# Patient Record
Sex: Male | Born: 1935 | Race: White | Hispanic: Yes
Health system: Midwestern US, Academic
[De-identification: ages and names within clinical notes are randomized; demographics above are authoritative.]

---

## 2016-12-25 ENCOUNTER — Encounter: Admit: 2016-12-25 | Discharge: 2016-12-25 | Payer: MEDICARE

## 2016-12-25 DIAGNOSIS — M25562 Pain in left knee: Principal | ICD-10-CM

## 2016-12-31 ENCOUNTER — Ambulatory Visit: Admit: 2016-12-31 | Discharge: 2016-12-31 | Payer: Commercial Managed Care - HMO

## 2016-12-31 ENCOUNTER — Encounter: Admit: 2016-12-31 | Discharge: 2016-12-31 | Payer: MEDICARE

## 2016-12-31 ENCOUNTER — Ambulatory Visit: Admit: 2016-12-31 | Discharge: 2016-12-31 | Payer: MEDICARE

## 2016-12-31 DIAGNOSIS — M25562 Pain in left knee: ICD-10-CM

## 2016-12-31 DIAGNOSIS — N400 Enlarged prostate without lower urinary tract symptoms: Principal | ICD-10-CM

## 2016-12-31 DIAGNOSIS — I8393 Asymptomatic varicose veins of bilateral lower extremities: ICD-10-CM

## 2016-12-31 DIAGNOSIS — M1712 Unilateral primary osteoarthritis, left knee: Principal | ICD-10-CM

## 2016-12-31 MED ORDER — DEXAMETHASONE SODIUM PHOSPHATE 10 MG/ML IJ SOLN
10 mg | Freq: Once | 0 refills | Status: CP | PRN
Start: 2016-12-31 — End: ?
  Administered 2016-12-31: 22:00:00 10 mg via INTRA_ARTICULAR

## 2016-12-31 MED ORDER — METHYLPREDNISOLONE ACETATE 80 MG/ML IJ SUSP
80 mg | Freq: Once | INTRA_ARTICULAR | 0 refills | Status: CP | PRN
Start: 2016-12-31 — End: ?
  Administered 2016-12-31: 22:00:00 80 mg via INTRA_ARTICULAR

## 2016-12-31 MED ORDER — ROPIVACAINE (PF) 5 MG/ML (0.5 %) IJ SOLN
5 mL | Freq: Once | INTRAMUSCULAR | 0 refills | Status: CP | PRN
Start: 2016-12-31 — End: ?
  Administered 2016-12-31: 22:00:00 5 mL via INTRAMUSCULAR

## 2017-08-14 ENCOUNTER — Ambulatory Visit: Admit: 2017-08-14 | Discharge: 2017-08-14 | Payer: Commercial Managed Care - HMO

## 2017-08-14 ENCOUNTER — Encounter: Admit: 2017-08-14 | Discharge: 2017-08-14 | Payer: MEDICARE

## 2017-08-14 DIAGNOSIS — L97509 Non-pressure chronic ulcer of other part of unspecified foot with unspecified severity: Principal | ICD-10-CM

## 2017-08-14 DIAGNOSIS — L304 Erythema intertrigo: ICD-10-CM

## 2017-08-14 DIAGNOSIS — N400 Enlarged prostate without lower urinary tract symptoms: Principal | ICD-10-CM

## 2017-08-14 DIAGNOSIS — I8393 Asymptomatic varicose veins of bilateral lower extremities: ICD-10-CM

## 2017-08-14 MED ORDER — NYSTATIN 100,000 UNIT/GRAM TP POWD
Freq: Once | TOPICAL | 0 refills | Status: AC
Start: 2017-08-14 — End: ?

## 2017-08-28 ENCOUNTER — Ambulatory Visit: Admit: 2017-08-28 | Discharge: 2017-08-28 | Payer: Commercial Managed Care - HMO

## 2017-08-28 ENCOUNTER — Encounter: Admit: 2017-08-28 | Discharge: 2017-08-28 | Payer: MEDICARE

## 2017-08-28 DIAGNOSIS — L304 Erythema intertrigo: Secondary | ICD-10-CM

## 2017-08-28 DIAGNOSIS — L97509 Non-pressure chronic ulcer of other part of unspecified foot with unspecified severity: Principal | ICD-10-CM

## 2017-08-28 DIAGNOSIS — N400 Enlarged prostate without lower urinary tract symptoms: Principal | ICD-10-CM

## 2017-08-28 DIAGNOSIS — I8393 Asymptomatic varicose veins of bilateral lower extremities: ICD-10-CM

## 2017-09-16 ENCOUNTER — Encounter: Admit: 2017-09-16 | Discharge: 2017-09-16 | Payer: MEDICARE

## 2017-09-16 ENCOUNTER — Ambulatory Visit: Admit: 2017-09-16 | Discharge: 2017-09-16 | Payer: Commercial Managed Care - HMO

## 2017-09-16 DIAGNOSIS — I8393 Asymptomatic varicose veins of bilateral lower extremities: ICD-10-CM

## 2017-09-16 DIAGNOSIS — N400 Enlarged prostate without lower urinary tract symptoms: Principal | ICD-10-CM

## 2017-09-16 DIAGNOSIS — L304 Erythema intertrigo: Principal | ICD-10-CM

## 2017-10-02 ENCOUNTER — Ambulatory Visit: Admit: 2017-10-02 | Discharge: 2017-10-03 | Payer: Commercial Managed Care - HMO

## 2017-10-02 ENCOUNTER — Encounter: Admit: 2017-10-02 | Discharge: 2017-10-02 | Payer: MEDICARE

## 2017-10-02 DIAGNOSIS — I8393 Asymptomatic varicose veins of bilateral lower extremities: ICD-10-CM

## 2017-10-02 DIAGNOSIS — N400 Enlarged prostate without lower urinary tract symptoms: Principal | ICD-10-CM

## 2017-10-02 MED ORDER — NYSTATIN 100,000 UNIT/GRAM TP POWD
Freq: Once | TOPICAL | 0 refills | Status: CP
Start: 2017-10-02 — End: ?
  Administered 2017-10-02: 17:00:00 via TOPICAL

## 2017-10-03 DIAGNOSIS — L304 Erythema intertrigo: Principal | ICD-10-CM

## 2018-04-13 ENCOUNTER — Encounter: Admit: 2018-04-13 | Discharge: 2018-04-13 | Payer: MEDICARE

## 2018-04-13 ENCOUNTER — Encounter: Admit: 2018-04-13 | Discharge: 2018-04-14 | Payer: MEDICARE

## 2018-04-14 ENCOUNTER — Encounter: Admit: 2018-04-14 | Discharge: 2018-04-14 | Payer: MEDICARE

## 2018-04-14 ENCOUNTER — Inpatient Hospital Stay: Admit: 2018-04-14 | Discharge: 2018-04-14 | Payer: MEDICARE

## 2018-04-14 ENCOUNTER — Observation Stay: Admit: 2018-04-14 | Discharge: 2018-04-17 | Payer: Commercial Managed Care - HMO

## 2018-04-14 DIAGNOSIS — N400 Enlarged prostate without lower urinary tract symptoms: Principal | ICD-10-CM

## 2018-04-14 DIAGNOSIS — I8393 Asymptomatic varicose veins of bilateral lower extremities: ICD-10-CM

## 2018-04-14 LAB — BASIC METABOLIC PANEL
Lab: 1 mg/dL (ref 0.4–1.24)
Lab: 106 MMOL/L (ref 98–110)
Lab: 134 mg/dL — ABNORMAL HIGH (ref 70–100)
Lab: 138 MMOL/L — ABNORMAL LOW (ref 137–147)
Lab: 23 MMOL/L (ref 21–30)
Lab: 28 mg/dL — ABNORMAL HIGH (ref 7–25)
Lab: 5.1 MMOL/L (ref 3.5–5.1)
Lab: 8.4 mg/dL — ABNORMAL LOW (ref 8.5–10.6)
Lab: 9 % (ref 3–12)
Lab: >60 mL/min (ref >60)
Lab: >60 mL/min (ref >60)

## 2018-04-14 LAB — PTT (APTT): Lab: 28 s — ABNORMAL LOW (ref 24.0–36.5)

## 2018-04-14 LAB — PROTIME INR (PT): Lab: 1 M/UL — ABNORMAL LOW (ref 0.8–1.2)

## 2018-04-14 LAB — CBC: Lab: 11 10*3/uL — ABNORMAL HIGH (ref 4.5–11.0)

## 2018-04-14 LAB — CREATINE KINASE-CPK: Lab: 121 U/L — ABNORMAL HIGH (ref 35–232)

## 2018-04-14 LAB — POC GLUCOSE: Lab: 121 mg/dL — ABNORMAL HIGH (ref 70–100)

## 2018-04-14 MED ORDER — SENNOSIDES-DOCUSATE SODIUM 8.6-50 MG PO TAB
1 | Freq: Two times a day (BID) | ORAL | 0 refills | Status: DC
Start: 2018-04-14 — End: 2018-04-18
  Administered 2018-04-16 – 2018-04-17 (×3): 1 via ORAL

## 2018-04-14 MED ORDER — ACETAMINOPHEN 500 MG PO TAB
1000 mg | ORAL | 0 refills | Status: DC | PRN
Start: 2018-04-14 — End: 2018-04-18
  Administered 2018-04-14 – 2018-04-17 (×7): 1000 mg via ORAL

## 2018-04-14 MED ORDER — ONDANSETRON HCL (PF) 4 MG/2 ML IJ SOLN
4 mg | INTRAVENOUS | 0 refills | Status: DC | PRN
Start: 2018-04-14 — End: 2018-04-18
  Administered 2018-04-14 (×2): 4 mg via INTRAVENOUS

## 2018-04-14 MED ORDER — SODIUM CHLORIDE 0.9 % IV SOLP
500 mL | INTRAVENOUS | 0 refills | Status: CP
Start: 2018-04-14 — End: ?
  Administered 2018-04-15: 01:00:00 500 mL via INTRAVENOUS

## 2018-04-14 MED ORDER — TRAMADOL 50 MG PO TAB
50 mg | ORAL | 0 refills | Status: DC | PRN
Start: 2018-04-14 — End: 2018-04-18
  Administered 2018-04-14 – 2018-04-17 (×4): 50 mg via ORAL

## 2018-04-14 MED ORDER — METOCLOPRAMIDE HCL 5 MG/ML IJ SOLN
10 mg | INTRAVENOUS | 0 refills | Status: DC
Start: 2018-04-14 — End: 2018-04-17
  Administered 2018-04-14 – 2018-04-17 (×10): 10 mg via INTRAVENOUS

## 2018-04-14 MED ORDER — POLYETHYLENE GLYCOL 3350 17 GRAM PO PWPK
17 g | Freq: Every day | ORAL | 0 refills | Status: DC
Start: 2018-04-14 — End: 2018-04-18
  Administered 2018-04-17: 15:00:00 17 g via ORAL

## 2018-04-15 ENCOUNTER — Encounter: Admit: 2018-04-15 | Discharge: 2018-04-15 | Payer: MEDICARE

## 2018-04-15 DIAGNOSIS — S0219XA Other fracture of base of skull, initial encounter for closed fracture: Principal | ICD-10-CM

## 2018-04-15 LAB — MAGNESIUM: Lab: 2.1 mg/dL — ABNORMAL LOW (ref 1.6–2.6)

## 2018-04-15 LAB — BASIC METABOLIC PANEL
Lab: 1 mg/dL — ABNORMAL LOW (ref 0.4–1.24)
Lab: 108 mg/dL — ABNORMAL HIGH (ref 70–100)
Lab: 138 MMOL/L — ABNORMAL LOW (ref 137–147)
Lab: 20 mg/dL (ref 7–25)
Lab: 24 MMOL/L (ref 21–30)
Lab: 8 pg (ref 3–12)
Lab: 8.7 mg/dL (ref 8.5–10.6)
Lab: >60 mL/min (ref >60)
Lab: >60 mL/min (ref >60)

## 2018-04-15 LAB — VRE SCREEN

## 2018-04-15 LAB — CREATINE KINASE-CPK: Lab: 929 U/L — ABNORMAL HIGH (ref 35–232)

## 2018-04-15 LAB — CBC: Lab: 10 10*3/uL (ref 4.5–11.0)

## 2018-04-15 LAB — MRSA SCREEN

## 2018-04-15 LAB — PHOSPHORUS: Lab: 2.3 mg/dL — ABNORMAL LOW (ref 2.0–4.5)

## 2018-04-15 MED ORDER — SODIUM CHLORIDE 0.9 % IJ SOLN
50 mL | Freq: Once | INTRAVENOUS | 0 refills | Status: CP
Start: 2018-04-15 — End: ?
  Administered 2018-04-15: 21:00:00 50 mL via INTRAVENOUS

## 2018-04-15 MED ORDER — IOHEXOL 350 MG IODINE/ML IV SOLN
80 mL | Freq: Once | INTRAVENOUS | 0 refills | Status: CP
Start: 2018-04-15 — End: ?
  Administered 2018-04-15: 21:00:00 80 mL via INTRAVENOUS

## 2018-04-15 MED ORDER — SODIUM CHLORIDE 0.9 % IV SOLP
500 mL | Freq: Once | INTRAVENOUS | 0 refills | Status: CP
Start: 2018-04-15 — End: ?
  Administered 2018-04-15: 20:00:00 500 mL via INTRAVENOUS

## 2018-04-15 MED ORDER — ATORVASTATIN 20 MG PO TAB
10 mg | Freq: Every day | ORAL | 0 refills | Status: DC
Start: 2018-04-15 — End: 2018-04-15

## 2018-04-16 LAB — MAGNESIUM: Lab: 2 mg/dL — ABNORMAL LOW (ref 1.6–2.6)

## 2018-04-16 LAB — BASIC METABOLIC PANEL
Lab: 107 MMOL/L — ABNORMAL LOW (ref 98–110)
Lab: 142 MMOL/L — ABNORMAL LOW (ref 137–147)

## 2018-04-16 LAB — CBC: Lab: 8.5 K/UL — ABNORMAL LOW (ref 4.5–11.0)

## 2018-04-16 LAB — PHOSPHORUS: Lab: 2.2 mg/dL (ref 2.0–4.5)

## 2018-04-16 LAB — CREATINE KINASE-CPK: Lab: 605 U/L — ABNORMAL HIGH (ref 35–232)

## 2018-04-16 MED ORDER — POTASSIUM PHOSPHATE, MONOBASIC 500 MG PO TBSO
2 | Freq: Once | ORAL | 0 refills | Status: CP
Start: 2018-04-16 — End: ?
  Administered 2018-04-16: 14:00:00 2 via ORAL

## 2018-04-16 MED ORDER — ATORVASTATIN 40 MG PO TAB
40 mg | ORAL_TABLET | Freq: Every day | ORAL | 0 refills | Status: CN
Start: 2018-04-16 — End: ?

## 2018-04-16 MED ORDER — ATORVASTATIN 10 MG PO TAB
10 mg | ORAL_TABLET | Freq: Every day | ORAL | 0 refills | Status: CN
Start: 2018-04-16 — End: ?

## 2018-04-16 MED ORDER — ATORVASTATIN 40 MG PO TAB
40 mg | Freq: Every day | ORAL | 0 refills | Status: DC
Start: 2018-04-16 — End: 2018-04-18
  Administered 2018-04-17: 02:00:00 40 mg via ORAL

## 2018-04-16 MED ORDER — MAGNESIUM HYDROXIDE 2,400 MG/10 ML PO SUSP
10 mL | Freq: Every day | ORAL | 0 refills | Status: DC
Start: 2018-04-16 — End: 2018-04-18
  Administered 2018-04-16: 20:00:00 10 mL via ORAL

## 2018-04-16 MED ORDER — ASPIRIN 81 MG PO TBEC
81 mg | Freq: Every day | ORAL | 0 refills | Status: DC
Start: 2018-04-16 — End: 2018-04-18
  Administered 2018-04-16 – 2018-04-17 (×2): 81 mg via ORAL

## 2018-04-16 MED ORDER — ASPIRIN 81 MG PO TBEC
81 mg | ORAL_TABLET | Freq: Every day | ORAL | 0 refills | Status: CN
Start: 2018-04-16 — End: ?

## 2018-04-16 MED ORDER — ATORVASTATIN 20 MG PO TAB
10 mg | Freq: Every day | ORAL | 0 refills | Status: DC
Start: 2018-04-16 — End: 2018-04-16

## 2018-04-17 ENCOUNTER — Inpatient Hospital Stay: Admit: 2018-04-14 | Discharge: 2018-04-14 | Payer: MEDICARE

## 2018-04-17 ENCOUNTER — Inpatient Hospital Stay: Admit: 2018-04-15 | Discharge: 2018-04-15 | Payer: MEDICARE

## 2018-04-17 ENCOUNTER — Encounter: Admit: 2018-04-17 | Discharge: 2018-04-17 | Payer: MEDICARE

## 2018-04-17 DIAGNOSIS — D62 Acute posthemorrhagic anemia: ICD-10-CM

## 2018-04-17 DIAGNOSIS — M6282 Rhabdomyolysis: ICD-10-CM

## 2018-04-17 DIAGNOSIS — M353 Polymyalgia rheumatica: ICD-10-CM

## 2018-04-17 DIAGNOSIS — F17210 Nicotine dependence, cigarettes, uncomplicated: ICD-10-CM

## 2018-04-17 DIAGNOSIS — G8929 Other chronic pain: ICD-10-CM

## 2018-04-17 DIAGNOSIS — E785 Hyperlipidemia, unspecified: ICD-10-CM

## 2018-04-17 DIAGNOSIS — I739 Peripheral vascular disease, unspecified: ICD-10-CM

## 2018-04-17 DIAGNOSIS — N179 Acute kidney failure, unspecified: ICD-10-CM

## 2018-04-17 DIAGNOSIS — R748 Abnormal levels of other serum enzymes: ICD-10-CM

## 2018-04-17 DIAGNOSIS — Z7409 Other reduced mobility: ICD-10-CM

## 2018-04-17 DIAGNOSIS — I714 Abdominal aortic aneurysm, without rupture: ICD-10-CM

## 2018-04-17 DIAGNOSIS — L304 Erythema intertrigo: ICD-10-CM

## 2018-04-17 DIAGNOSIS — M170 Bilateral primary osteoarthritis of knee: ICD-10-CM

## 2018-04-17 DIAGNOSIS — S0219XA Other fracture of base of skull, initial encounter for closed fracture: Principal | ICD-10-CM

## 2018-04-17 DIAGNOSIS — S0993XA Unspecified injury of face, initial encounter: ICD-10-CM

## 2018-04-17 DIAGNOSIS — G8911 Acute pain due to trauma: ICD-10-CM

## 2018-04-17 DIAGNOSIS — Z0489 Encounter for examination and observation for other specified reasons: ICD-10-CM

## 2018-04-17 MED ORDER — MAGNESIUM HYDROXIDE 2,400 MG/10 ML PO SUSP
10 mL | Freq: Every day | ORAL | 0 refills | 2.00000 days | Status: AC
Start: 2018-04-17 — End: 2018-05-23

## 2018-04-17 MED ORDER — SENNOSIDES-DOCUSATE SODIUM 8.6-50 MG PO TAB
1 | ORAL_TABLET | Freq: Two times a day (BID) | ORAL | 0 refills | Status: AC
Start: 2018-04-17 — End: 2018-05-13

## 2018-04-17 MED ORDER — ACETAMINOPHEN 500 MG PO TAB
1000 mg | ORAL | 0 refills | Status: DC | PRN
Start: 2018-04-17 — End: 2018-05-13

## 2018-04-17 MED ORDER — POLYETHYLENE GLYCOL 3350 17 GRAM PO PWPK
17 g | Freq: Every day | ORAL | 0 refills | 22.00000 days | Status: AC
Start: 2018-04-17 — End: 2018-04-28

## 2018-04-17 MED ORDER — ASPIRIN 81 MG PO TBEC
81 mg | ORAL_TABLET | Freq: Every day | ORAL | 0 refills | Status: AC
Start: 2018-04-17 — End: 2018-05-23

## 2018-04-17 MED ORDER — ATORVASTATIN 40 MG PO TAB
40 mg | ORAL_TABLET | Freq: Every day | ORAL | 0 refills | Status: AC
Start: 2018-04-17 — End: 2018-05-23

## 2018-04-17 MED ORDER — TRAMADOL 50 MG PO TAB
50 mg | ORAL_TABLET | ORAL | 0 refills | Status: AC | PRN
Start: 2018-04-17 — End: 2018-05-13

## 2018-04-17 MED ORDER — ONDANSETRON 4 MG PO TBDI
4 mg | ORAL_TABLET | ORAL | 0 refills | 8.00000 days | Status: AC | PRN
Start: 2018-04-17 — End: 2018-05-23

## 2018-04-17 MED ORDER — MELATONIN 3 MG PO TAB
3 mg | Freq: Every evening | ORAL | 0 refills | Status: SS
Start: 2018-04-17 — End: 2018-05-11

## 2018-04-21 ENCOUNTER — Encounter: Admit: 2018-04-21 | Discharge: 2018-04-21 | Payer: MEDICARE

## 2018-04-21 DIAGNOSIS — I714 Abdominal aortic aneurysm, without rupture: Principal | ICD-10-CM

## 2018-04-21 DIAGNOSIS — Z01818 Encounter for other preprocedural examination: ICD-10-CM

## 2018-04-21 DIAGNOSIS — Z9181 History of falling: ICD-10-CM

## 2018-04-21 MED ORDER — CEFAZOLIN INJ 1GM IVP
2 g | Freq: Once | INTRAVENOUS | 0 refills | Status: CN
Start: 2018-04-21 — End: ?

## 2018-04-28 ENCOUNTER — Encounter: Admit: 2018-04-28 | Discharge: 2018-04-28 | Payer: MEDICARE

## 2018-04-28 ENCOUNTER — Inpatient Hospital Stay: Admit: 2018-04-28 | Discharge: 2018-04-28 | Payer: MEDICARE

## 2018-04-28 ENCOUNTER — Ambulatory Visit: Admit: 2018-04-28 | Discharge: 2018-04-28 | Payer: MEDICARE

## 2018-04-28 ENCOUNTER — Ambulatory Visit: Admit: 2018-04-28 | Discharge: 2018-04-28 | Payer: Commercial Managed Care - HMO

## 2018-04-28 DIAGNOSIS — M1990 Unspecified osteoarthritis, unspecified site: ICD-10-CM

## 2018-04-28 DIAGNOSIS — N400 Enlarged prostate without lower urinary tract symptoms: Principal | ICD-10-CM

## 2018-04-28 DIAGNOSIS — I82409 Acute embolism and thrombosis of unspecified deep veins of unspecified lower extremity: ICD-10-CM

## 2018-04-28 DIAGNOSIS — I8393 Asymptomatic varicose veins of bilateral lower extremities: ICD-10-CM

## 2018-04-28 DIAGNOSIS — H532 Diplopia: ICD-10-CM

## 2018-04-28 DIAGNOSIS — H269 Unspecified cataract: ICD-10-CM

## 2018-04-28 DIAGNOSIS — Z01818 Encounter for other preprocedural examination: Principal | ICD-10-CM

## 2018-04-28 DIAGNOSIS — M353 Polymyalgia rheumatica: ICD-10-CM

## 2018-04-28 DIAGNOSIS — S060X0A Concussion without loss of consciousness, initial encounter: ICD-10-CM

## 2018-04-28 DIAGNOSIS — S0219XS Other fracture of base of skull, sequela: ICD-10-CM

## 2018-04-28 DIAGNOSIS — E785 Hyperlipidemia, unspecified: ICD-10-CM

## 2018-04-28 DIAGNOSIS — Z7409 Other reduced mobility: ICD-10-CM

## 2018-04-28 DIAGNOSIS — S0990XD Unspecified injury of head, subsequent encounter: ICD-10-CM

## 2018-04-28 LAB — CBC
Lab: 11 g/dL — ABNORMAL LOW (ref 13.5–16.5)
Lab: 14 % (ref 11–15)
Lab: 240 10*3/uL (ref 150–400)
Lab: 3.7 M/UL — ABNORMAL LOW (ref 4.4–5.5)
Lab: 30 pg (ref 26–34)
Lab: 33 g/dL (ref 32.0–36.0)
Lab: 34 % — ABNORMAL LOW (ref 40–50)
Lab: 7.5 FL (ref 7–11)
Lab: 9.9 10*3/uL (ref 4.5–11.0)
Lab: 92 FL (ref 80–100)

## 2018-04-28 MED ORDER — AMITRIPTYLINE 10 MG PO TAB
10 mg | ORAL_TABLET | Freq: Every evening | ORAL | 1 refills | Status: AC | PRN
Start: 2018-04-28 — End: 2018-04-28

## 2018-04-28 MED ORDER — ONDANSETRON HCL 4 MG PO TAB
4 mg | ORAL_TABLET | ORAL | 3 refills | 8.00000 days | Status: AC | PRN
Start: 2018-04-28 — End: 2018-04-28

## 2018-04-28 MED ORDER — ONDANSETRON HCL 4 MG PO TAB
4 mg | ORAL_TABLET | ORAL | 1 refills | Status: SS | PRN
Start: 2018-04-28 — End: 2018-05-11

## 2018-04-28 MED ORDER — AMITRIPTYLINE 10 MG PO TAB
10 mg | ORAL_TABLET | Freq: Every evening | ORAL | 1 refills | Status: AC | PRN
Start: 2018-04-28 — End: 2018-05-23

## 2018-04-29 ENCOUNTER — Encounter: Admit: 2018-04-29 | Discharge: 2018-04-29 | Payer: MEDICARE

## 2018-04-29 DIAGNOSIS — H269 Unspecified cataract: ICD-10-CM

## 2018-04-29 DIAGNOSIS — M353 Polymyalgia rheumatica: ICD-10-CM

## 2018-04-29 DIAGNOSIS — I82409 Acute embolism and thrombosis of unspecified deep veins of unspecified lower extremity: ICD-10-CM

## 2018-04-29 DIAGNOSIS — I8393 Asymptomatic varicose veins of bilateral lower extremities: ICD-10-CM

## 2018-04-29 DIAGNOSIS — M1990 Unspecified osteoarthritis, unspecified site: ICD-10-CM

## 2018-04-29 DIAGNOSIS — E785 Hyperlipidemia, unspecified: ICD-10-CM

## 2018-04-29 DIAGNOSIS — N400 Enlarged prostate without lower urinary tract symptoms: Principal | ICD-10-CM

## 2018-05-04 ENCOUNTER — Encounter: Admit: 2018-05-04 | Discharge: 2018-05-04 | Payer: MEDICARE

## 2018-05-07 ENCOUNTER — Encounter: Admit: 2018-05-07 | Discharge: 2018-05-07 | Payer: MEDICARE

## 2018-05-08 ENCOUNTER — Encounter: Admit: 2018-05-08 | Discharge: 2018-05-08 | Payer: MEDICARE

## 2018-05-08 DIAGNOSIS — M1990 Unspecified osteoarthritis, unspecified site: ICD-10-CM

## 2018-05-08 DIAGNOSIS — I714 Abdominal aortic aneurysm, without rupture: ICD-10-CM

## 2018-05-08 DIAGNOSIS — H269 Unspecified cataract: ICD-10-CM

## 2018-05-08 DIAGNOSIS — N400 Enlarged prostate without lower urinary tract symptoms: Principal | ICD-10-CM

## 2018-05-08 DIAGNOSIS — I82409 Acute embolism and thrombosis of unspecified deep veins of unspecified lower extremity: ICD-10-CM

## 2018-05-08 DIAGNOSIS — M5030 Other cervical disc degeneration, unspecified cervical region: ICD-10-CM

## 2018-05-08 DIAGNOSIS — E785 Hyperlipidemia, unspecified: ICD-10-CM

## 2018-05-08 DIAGNOSIS — I8393 Asymptomatic varicose veins of bilateral lower extremities: ICD-10-CM

## 2018-05-08 DIAGNOSIS — M353 Polymyalgia rheumatica: ICD-10-CM

## 2018-05-08 DIAGNOSIS — I6523 Occlusion and stenosis of bilateral carotid arteries: ICD-10-CM

## 2018-05-11 ENCOUNTER — Inpatient Hospital Stay: Admit: 2018-05-11 | Discharge: 2018-05-11 | Payer: MEDICARE

## 2018-05-11 ENCOUNTER — Encounter: Admit: 2018-05-11 | Discharge: 2018-05-11 | Payer: MEDICARE

## 2018-05-11 DIAGNOSIS — I714 Abdominal aortic aneurysm, without rupture: ICD-10-CM

## 2018-05-11 DIAGNOSIS — M1990 Unspecified osteoarthritis, unspecified site: ICD-10-CM

## 2018-05-11 DIAGNOSIS — H269 Unspecified cataract: ICD-10-CM

## 2018-05-11 DIAGNOSIS — I82409 Acute embolism and thrombosis of unspecified deep veins of unspecified lower extremity: ICD-10-CM

## 2018-05-11 DIAGNOSIS — M353 Polymyalgia rheumatica: ICD-10-CM

## 2018-05-11 DIAGNOSIS — M5030 Other cervical disc degeneration, unspecified cervical region: ICD-10-CM

## 2018-05-11 DIAGNOSIS — I8393 Asymptomatic varicose veins of bilateral lower extremities: ICD-10-CM

## 2018-05-11 DIAGNOSIS — N400 Enlarged prostate without lower urinary tract symptoms: Principal | ICD-10-CM

## 2018-05-11 DIAGNOSIS — I6523 Occlusion and stenosis of bilateral carotid arteries: ICD-10-CM

## 2018-05-11 DIAGNOSIS — E785 Hyperlipidemia, unspecified: ICD-10-CM

## 2018-05-11 LAB — COMPREHENSIVE METABOLIC PANEL
Lab: 0.4 mg/dL (ref 0.3–1.2)
Lab: 1.1 mg/dL (ref 0.4–1.24)
Lab: 107 MMOL/L (ref 98–110)
Lab: 142 MMOL/L (ref 137–147)
Lab: 19 U/L (ref 7–56)
Lab: 22 U/L (ref 7–40)
Lab: 24 mg/dL (ref 7–25)
Lab: 27 MMOL/L (ref 21–30)
Lab: 4 g/dL (ref 3.5–5.0)
Lab: 4.3 MMOL/L (ref 3.5–5.1)
Lab: 7 g/dL (ref 6.0–8.0)
Lab: 8 (ref 3–12)
Lab: 86 U/L (ref 25–110)
Lab: 9.6 mg/dL (ref 8.5–10.6)
Lab: 98 mg/dL (ref 70–100)
Lab: >60 mL/min (ref >60)
Lab: >60 mL/min (ref >60)

## 2018-05-11 LAB — CBC
Lab: 12 g/dL — ABNORMAL LOW (ref 13.5–16.5)
Lab: 14 % (ref 11–15)
Lab: 175 10*3/uL (ref 150–400)
Lab: 3.9 M/UL — ABNORMAL LOW (ref 4.4–5.5)
Lab: 31 pg (ref 26–34)
Lab: 33 g/dL (ref 32.0–36.0)
Lab: 37 % — ABNORMAL LOW (ref 40–50)
Lab: 7.7 FL (ref 7–11)
Lab: 8.4 10*3/uL (ref 4.5–11.0)
Lab: 93 FL (ref 80–100)

## 2018-05-11 MED ORDER — ACETAMINOPHEN 325 MG PO TAB
650 mg | ORAL | 0 refills | Status: DC | PRN
Start: 2018-05-11 — End: 2018-05-11

## 2018-05-11 MED ORDER — LIDOCAINE (PF) 200 MG/10 ML (2 %) IJ SYRG
0 refills | Status: DC
Start: 2018-05-11 — End: 2018-05-11
  Administered 2018-05-11: 14:00:00 100 mg via INTRAVENOUS

## 2018-05-11 MED ORDER — HEPARIN (PORCINE) 1,000 UNIT/ML IJ SOLN
0 refills | Status: DC
Start: 2018-05-11 — End: 2018-05-11

## 2018-05-11 MED ORDER — PROPOFOL INJ 10 MG/ML IV VIAL
0 refills | Status: DC
Start: 2018-05-11 — End: 2018-05-11
  Administered 2018-05-11: 14:00:00 50 mg via INTRAVENOUS

## 2018-05-11 MED ORDER — ALUM-MAG HYDROXIDE-SIMETH 200-200-20 MG/5 ML PO SUSP
30 mL | ORAL | 0 refills | Status: DC | PRN
Start: 2018-05-11 — End: 2018-05-13

## 2018-05-11 MED ORDER — PROTAMINE 10 MG/ML IV SOLN
0 refills | Status: DC
Start: 2018-05-11 — End: 2018-05-11
  Administered 2018-05-11: 16:00:00 30 mg via INTRAVENOUS

## 2018-05-11 MED ORDER — ONDANSETRON HCL (PF) 4 MG/2 ML IJ SOLN
4 mg | INTRAVENOUS | 0 refills | Status: DC | PRN
Start: 2018-05-11 — End: 2018-05-13
  Administered 2018-05-12: 02:00:00 4 mg via INTRAVENOUS

## 2018-05-11 MED ORDER — ENOXAPARIN 40 MG/0.4 ML SC SYRG
40 mg | Freq: Every day | SUBCUTANEOUS | 0 refills | Status: DC
Start: 2018-05-11 — End: 2018-05-13
  Administered 2018-05-12 – 2018-05-13 (×2): 40 mg via SUBCUTANEOUS

## 2018-05-11 MED ORDER — PHENYLEPHRINE IV DRIP (STD CONC)
0 refills | Status: DC
Start: 2018-05-11 — End: 2018-05-11
  Administered 2018-05-11 (×2): 0.2 ug/kg/min via INTRAVENOUS

## 2018-05-11 MED ORDER — DEXTRAN 70-HYPROMELLOSE (PF) 0.1-0.3 % OP DPET
0 refills | Status: DC
Start: 2018-05-11 — End: 2018-05-11
  Administered 2018-05-11: 15:00:00 1 [drp] via OPHTHALMIC

## 2018-05-11 MED ORDER — SUGAMMADEX 100 MG/ML IV SOLN
INTRAVENOUS | 0 refills | Status: DC
Start: 2018-05-11 — End: 2018-05-11
  Administered 2018-05-11: 16:00:00 150 mg via INTRAVENOUS

## 2018-05-11 MED ORDER — CEFAZOLIN INJ 1GM IVP
2 g | Freq: Once | INTRAVENOUS | 0 refills | Status: CP
Start: 2018-05-11 — End: ?
  Administered 2018-05-11: 15:00:00 2 g via INTRAVENOUS

## 2018-05-11 MED ORDER — CEFAZOLIN INJ 1GM IVP
1 g | INTRAVENOUS | 0 refills | Status: DC
Start: 2018-05-11 — End: 2018-05-11

## 2018-05-11 MED ORDER — PROMETHAZINE 25 MG/ML IJ SOLN
6.25 mg | INTRAVENOUS | 0 refills | Status: DC | PRN
Start: 2018-05-11 — End: 2018-05-11

## 2018-05-11 MED ORDER — ONDANSETRON HCL 4 MG PO TAB
4 mg | ORAL | 0 refills | Status: DC | PRN
Start: 2018-05-11 — End: 2018-05-13

## 2018-05-11 MED ORDER — DEXAMETHASONE SODIUM PHOSPHATE 4 MG/ML IJ SOLN
INTRAVENOUS | 0 refills | Status: DC
Start: 2018-05-11 — End: 2018-05-11
  Administered 2018-05-11: 15:00:00 4 mg via INTRAVENOUS

## 2018-05-11 MED ORDER — FENTANYL CITRATE (PF) 50 MCG/ML IJ SOLN
25-50 ug | INTRAVENOUS | 0 refills | Status: DC | PRN
Start: 2018-05-11 — End: 2018-05-13

## 2018-05-11 MED ORDER — FENTANYL CITRATE (PF) 50 MCG/ML IJ SOLN
0 refills | Status: DC
Start: 2018-05-11 — End: 2018-05-11
  Administered 2018-05-11 (×2): 50 ug via INTRAVENOUS

## 2018-05-11 MED ORDER — MAGNESIUM HYDROXIDE 2,400 MG/10 ML PO SUSP
10 mL | ORAL | 0 refills | Status: DC | PRN
Start: 2018-05-11 — End: 2018-05-11

## 2018-05-11 MED ORDER — NITROGLYCERIN 0.4 MG SL SUBL
.4 mg | SUBLINGUAL | 0 refills | Status: DC | PRN
Start: 2018-05-11 — End: 2018-05-11

## 2018-05-11 MED ORDER — SENNA-DOCUSATE 8.8/50MG/10ML PO SOLN
20 mL | Freq: Two times a day (BID) | NASOGASTRIC | 0 refills | Status: DC
Start: 2018-05-11 — End: 2018-05-13

## 2018-05-11 MED ORDER — AMITRIPTYLINE 10 MG PO TAB
10 mg | Freq: Every evening | ORAL | 0 refills | Status: DC | PRN
Start: 2018-05-11 — End: 2018-05-13
  Administered 2018-05-12 – 2018-05-13 (×2): 10 mg via ORAL

## 2018-05-11 MED ORDER — POTASSIUM CHLORIDE IN WATER 10 MEQ/50 ML IV PGBK
10 meq | INTRAVENOUS | 0 refills | Status: DC | PRN
Start: 2018-05-11 — End: 2018-05-13

## 2018-05-11 MED ORDER — SENNOSIDES-DOCUSATE SODIUM 8.6-50 MG PO TAB
2 | Freq: Two times a day (BID) | ORAL | 0 refills | Status: DC
Start: 2018-05-11 — End: 2018-05-13
  Administered 2018-05-12 – 2018-05-13 (×3): 2 via ORAL

## 2018-05-11 MED ORDER — FENTANYL CITRATE (PF) 50 MCG/ML IJ SOLN
25 ug | INTRAVENOUS | 0 refills | Status: DC | PRN
Start: 2018-05-11 — End: 2018-05-11

## 2018-05-11 MED ORDER — PHENYLEPHRINE IN 0.9% NACL(PF) 1 MG/10 ML (100 MCG/ML) IV SYRG
INTRAVENOUS | 0 refills | Status: DC
Start: 2018-05-11 — End: 2018-05-11
  Administered 2018-05-11: 15:00:00 50 ug via INTRAVENOUS
  Administered 2018-05-11: 15:00:00 100 ug via INTRAVENOUS
  Administered 2018-05-11: 15:00:00 50 ug via INTRAVENOUS

## 2018-05-11 MED ORDER — ASPIRIN 81 MG PO CHEW
81 mg | Freq: Once | ORAL | 0 refills | Status: DC
Start: 2018-05-11 — End: 2018-05-11

## 2018-05-11 MED ORDER — FAMOTIDINE 20 MG PO TAB
20 mg | Freq: Two times a day (BID) | ORAL | 0 refills | Status: DC
Start: 2018-05-11 — End: 2018-05-13
  Administered 2018-05-11 – 2018-05-13 (×5): 20 mg via ORAL

## 2018-05-11 MED ORDER — CEFAZOLIN INJ 1GM IVP
2 g | Freq: Once | INTRAVENOUS | 0 refills | Status: DC
Start: 2018-05-11 — End: 2018-05-11

## 2018-05-11 MED ORDER — POTASSIUM CHLORIDE 20 MEQ/15 ML PO LIQD
20-40 meq | NASOGASTRIC | 0 refills | Status: DC | PRN
Start: 2018-05-11 — End: 2018-05-13

## 2018-05-11 MED ORDER — OXYCODONE 5 MG PO TAB
5-10 mg | ORAL | 0 refills | Status: DC | PRN
Start: 2018-05-11 — End: 2018-05-13

## 2018-05-11 MED ORDER — HYDRALAZINE 20 MG/ML IJ SOLN
10 mg | INTRAVENOUS | 0 refills | Status: DC | PRN
Start: 2018-05-11 — End: 2018-05-13

## 2018-05-11 MED ORDER — SODIUM CHLORIDE 0.45 % IV SOLP
INTRAVENOUS | 0 refills | Status: AC
Start: 2018-05-11 — End: ?
  Administered 2018-05-11: 18:00:00 1000.000 mL via INTRAVENOUS

## 2018-05-11 MED ORDER — HALOPERIDOL LACTATE 5 MG/ML IJ SOLN
0 refills | Status: DC
Start: 2018-05-11 — End: 2018-05-11
  Administered 2018-05-11: 15:00:00 1 mg via INTRAVENOUS

## 2018-05-11 MED ORDER — ALUMINUM-MAGNESIUM HYDROXIDE 200-200 MG/5 ML PO SUSP
30 mL | ORAL | 0 refills | Status: DC | PRN
Start: 2018-05-11 — End: 2018-05-11

## 2018-05-11 MED ORDER — POTASSIUM CHLORIDE 20 MEQ PO TBTQ
20-40 meq | ORAL | 0 refills | Status: DC | PRN
Start: 2018-05-11 — End: 2018-05-13
  Administered 2018-05-12 – 2018-05-13 (×2): 20 meq via ORAL

## 2018-05-11 MED ORDER — SODIUM CHLORIDE 0.9 % IV SOLP
INTRAVENOUS | 0 refills | Status: AC
Start: 2018-05-11 — End: ?
  Administered 2018-05-11: 13:00:00 1000.000 mL via INTRAVENOUS

## 2018-05-11 MED ORDER — TEMAZEPAM 15 MG PO CAP
15 mg | Freq: Every evening | ORAL | 0 refills | Status: DC | PRN
Start: 2018-05-11 — End: 2018-05-11

## 2018-05-11 MED ORDER — ONDANSETRON HCL (PF) 4 MG/2 ML IJ SOLN
INTRAVENOUS | 0 refills | Status: DC
Start: 2018-05-11 — End: 2018-05-11
  Administered 2018-05-11: 16:00:00 4 mg via INTRAVENOUS

## 2018-05-11 MED ORDER — FAMOTIDINE 40 MG/5 ML (8 MG/ML) PO SUSP
20 mg | Freq: Two times a day (BID) | NASOGASTRIC | 0 refills | Status: DC
Start: 2018-05-11 — End: 2018-05-13

## 2018-05-11 MED ORDER — ROCURONIUM 10 MG/ML IV SOLN
INTRAVENOUS | 0 refills | Status: DC
Start: 2018-05-11 — End: 2018-05-11
  Administered 2018-05-11: 14:00:00 50 mg via INTRAVENOUS

## 2018-05-12 ENCOUNTER — Encounter: Admit: 2018-05-12 | Discharge: 2018-05-12 | Payer: MEDICARE

## 2018-05-12 DIAGNOSIS — I714 Abdominal aortic aneurysm, without rupture: Principal | ICD-10-CM

## 2018-05-12 DIAGNOSIS — Z9889 Other specified postprocedural states: ICD-10-CM

## 2018-05-12 LAB — CBC: Lab: 13 K/UL — ABNORMAL HIGH (ref >60)

## 2018-05-12 LAB — BASIC METABOLIC PANEL: Lab: 140 MMOL/L — ABNORMAL LOW (ref 137–147)

## 2018-05-12 MED ORDER — ATORVASTATIN 40 MG PO TAB
40 mg | Freq: Every evening | ORAL | 0 refills | Status: DC
Start: 2018-05-12 — End: 2018-05-13
  Administered 2018-05-13: 03:00:00 40 mg via ORAL

## 2018-05-12 MED ORDER — ASPIRIN 81 MG PO TBEC
81 mg | Freq: Every day | ORAL | 0 refills | Status: DC
Start: 2018-05-12 — End: 2018-05-13
  Administered 2018-05-12 – 2018-05-13 (×2): 81 mg via ORAL

## 2018-05-12 MED ORDER — TAMSULOSIN 0.4 MG PO CAP
0.4 mg | Freq: Every day | ORAL | 0 refills | Status: DC
Start: 2018-05-12 — End: 2018-05-13
  Administered 2018-05-12 – 2018-05-13 (×2): 0.4 mg via ORAL

## 2018-05-13 ENCOUNTER — Inpatient Hospital Stay
Admit: 2018-05-11 | Discharge: 2018-05-13 | Disposition: A | Discharge status: Rehabilitation Facility | Payer: Commercial Managed Care - HMO

## 2018-05-13 ENCOUNTER — Encounter: Admit: 2018-05-13 | Discharge: 2018-05-13 | Payer: MEDICARE

## 2018-05-13 ENCOUNTER — Inpatient Hospital Stay: Admit: 2018-05-13 | Discharge: 2018-05-23 | Payer: Commercial Managed Care - HMO

## 2018-05-13 DIAGNOSIS — Z79899 Other long term (current) drug therapy: ICD-10-CM

## 2018-05-13 DIAGNOSIS — I82409 Acute embolism and thrombosis of unspecified deep veins of unspecified lower extremity: ICD-10-CM

## 2018-05-13 DIAGNOSIS — F17210 Nicotine dependence, cigarettes, uncomplicated: ICD-10-CM

## 2018-05-13 DIAGNOSIS — H269 Unspecified cataract: ICD-10-CM

## 2018-05-13 DIAGNOSIS — H532 Diplopia: ICD-10-CM

## 2018-05-13 DIAGNOSIS — N400 Enlarged prostate without lower urinary tract symptoms: Principal | ICD-10-CM

## 2018-05-13 DIAGNOSIS — R4189 Other symptoms and signs involving cognitive functions and awareness: ICD-10-CM

## 2018-05-13 DIAGNOSIS — I714 Abdominal aortic aneurysm, without rupture: ICD-10-CM

## 2018-05-13 DIAGNOSIS — M1990 Unspecified osteoarthritis, unspecified site: ICD-10-CM

## 2018-05-13 DIAGNOSIS — I6523 Occlusion and stenosis of bilateral carotid arteries: ICD-10-CM

## 2018-05-13 DIAGNOSIS — E785 Hyperlipidemia, unspecified: ICD-10-CM

## 2018-05-13 DIAGNOSIS — Z7982 Long term (current) use of aspirin: ICD-10-CM

## 2018-05-13 DIAGNOSIS — I8393 Asymptomatic varicose veins of bilateral lower extremities: ICD-10-CM

## 2018-05-13 DIAGNOSIS — S069X0D Unspecified intracranial injury without loss of consciousness, subsequent encounter: ICD-10-CM

## 2018-05-13 DIAGNOSIS — S060X0S Concussion without loss of consciousness, sequela: ICD-10-CM

## 2018-05-13 DIAGNOSIS — D72829 Elevated white blood cell count, unspecified: ICD-10-CM

## 2018-05-13 DIAGNOSIS — M353 Polymyalgia rheumatica: ICD-10-CM

## 2018-05-13 DIAGNOSIS — M5030 Other cervical disc degeneration, unspecified cervical region: ICD-10-CM

## 2018-05-13 MED ORDER — ATORVASTATIN 40 MG PO TAB
40 mg | Freq: Every evening | ORAL | 0 refills | Status: DC
Start: 2018-05-13 — End: 2018-05-23
  Administered 2018-05-14 – 2018-05-23 (×10): 40 mg via ORAL

## 2018-05-13 MED ORDER — ACETAMINOPHEN 325 MG PO TAB
650 mg | ORAL | 0 refills | Status: DC | PRN
Start: 2018-05-13 — End: 2018-05-23

## 2018-05-13 MED ORDER — OXYCODONE 5 MG PO TAB
5 mg | ORAL | 0 refills | Status: DC | PRN
Start: 2018-05-13 — End: 2018-05-14

## 2018-05-13 MED ORDER — SENNOSIDES 8.6 MG PO TAB
2 | Freq: Every evening | ORAL | 0 refills | Status: DC
Start: 2018-05-13 — End: 2018-05-18
  Administered 2018-05-14 – 2018-05-18 (×5): 2 via ORAL

## 2018-05-13 MED ORDER — AMITRIPTYLINE 10 MG PO TAB
10 mg | Freq: Every evening | ORAL | 0 refills | Status: DC | PRN
Start: 2018-05-13 — End: 2018-05-19

## 2018-05-13 MED ORDER — AMITRIPTYLINE 10 MG PO TAB
10 mg | Freq: Every evening | ORAL | 0 refills | Status: CN | PRN
Start: 2018-05-13 — End: ?

## 2018-05-13 MED ORDER — SENNOSIDES-DOCUSATE SODIUM 8.6-50 MG PO TAB
2 | Freq: Two times a day (BID) | ORAL | 0 refills | Status: SS
Start: 2018-05-13 — End: 2018-05-22

## 2018-05-13 MED ORDER — ALUM-MAG HYDROXIDE-SIMETH 200-200-20 MG/5 ML PO SUSP
30 mL | ORAL | 0 refills | Status: CN | PRN
Start: 2018-05-13 — End: ?

## 2018-05-13 MED ORDER — FAMOTIDINE 20 MG PO TAB
20 mg | Freq: Two times a day (BID) | ORAL | 0 refills | Status: DC
Start: 2018-05-13 — End: 2018-05-23
  Administered 2018-05-14 – 2018-05-23 (×20): 20 mg via ORAL

## 2018-05-13 MED ORDER — ASPIRIN 81 MG PO TBEC
81 mg | Freq: Every day | ORAL | 0 refills | Status: CN
Start: 2018-05-13 — End: ?

## 2018-05-13 MED ORDER — ACETAMINOPHEN 325 MG PO TAB
650 mg | ORAL | 0 refills | Status: AC | PRN
Start: 2018-05-13 — End: 2018-05-23

## 2018-05-13 MED ORDER — OXYCODONE 5 MG PO TAB
5-10 mg | ORAL_TABLET | ORAL | 0 refills | 6.00000 days | Status: AC | PRN
Start: 2018-05-13 — End: 2018-05-23

## 2018-05-13 MED ORDER — ALUMINUM-MAGNESIUM HYDROXIDE 200-200 MG/5 ML PO SUSP
30 mL | ORAL | 0 refills | Status: DC | PRN
Start: 2018-05-13 — End: 2018-05-23

## 2018-05-13 MED ORDER — ONDANSETRON HCL 4 MG PO TAB
4 mg | ORAL | 0 refills | Status: DC | PRN
Start: 2018-05-13 — End: 2018-05-23

## 2018-05-13 MED ORDER — ALUM-MAG HYDROXIDE-SIMETH 200-200-20 MG/5 ML PO SUSP
30 mL | ORAL | 0 refills | Status: DC | PRN
Start: 2018-05-13 — End: 2018-05-13

## 2018-05-13 MED ORDER — TAMSULOSIN 0.4 MG PO CAP
0.4 mg | Freq: Every day | ORAL | 0 refills | Status: DC
Start: 2018-05-13 — End: 2018-05-23
  Administered 2018-05-14 – 2018-05-23 (×10): 0.4 mg via ORAL

## 2018-05-13 MED ORDER — BISACODYL 10 MG RE SUPP
10 mg | Freq: Every day | RECTAL | 0 refills | Status: DC | PRN
Start: 2018-05-13 — End: 2018-05-23

## 2018-05-13 MED ORDER — DOCUSATE SODIUM 100 MG PO CAP
100 mg | Freq: Two times a day (BID) | ORAL | 0 refills | Status: DC
Start: 2018-05-13 — End: 2018-05-18
  Administered 2018-05-14 – 2018-05-18 (×9): 100 mg via ORAL

## 2018-05-13 MED ORDER — FAMOTIDINE 20 MG PO TAB
20 mg | Freq: Two times a day (BID) | ORAL | 0 refills | Status: CN
Start: 2018-05-13 — End: ?

## 2018-05-13 MED ORDER — ENOXAPARIN 40 MG/0.4 ML SC SYRG
40 mg | Freq: Every day | SUBCUTANEOUS | 0 refills | Status: DC
Start: 2018-05-13 — End: 2018-05-23
  Administered 2018-05-14 – 2018-05-23 (×10): 40 mg via SUBCUTANEOUS

## 2018-05-13 MED ORDER — ASPIRIN 81 MG PO TBEC
81 mg | Freq: Every day | ORAL | 0 refills | Status: DC
Start: 2018-05-13 — End: 2018-05-23
  Administered 2018-05-14 – 2018-05-23 (×10): 81 mg via ORAL

## 2018-05-13 MED ORDER — TAMSULOSIN 0.4 MG PO CAP
0.4 mg | Freq: Every day | ORAL | 0 refills | Status: CN
Start: 2018-05-13 — End: ?

## 2018-05-13 MED ORDER — ATORVASTATIN 40 MG PO TAB
40 mg | Freq: Every evening | ORAL | 0 refills | Status: CN
Start: 2018-05-13 — End: ?

## 2018-05-13 MED ORDER — TAMSULOSIN 0.4 MG PO CAP
0.4 mg | ORAL_CAPSULE | Freq: Every day | ORAL | 0 refills | 90.00000 days | Status: AC
Start: 2018-05-13 — End: 2018-05-23

## 2018-05-13 MED ORDER — ENOXAPARIN 40 MG/0.4 ML SC SYRG
40 mg | Freq: Every day | SUBCUTANEOUS | 0 refills | Status: CN
Start: 2018-05-13 — End: ?

## 2018-05-13 MED ORDER — FAMOTIDINE 20 MG PO TAB
20 mg | ORAL_TABLET | Freq: Two times a day (BID) | ORAL | 0 refills | Status: SS
Start: 2018-05-13 — End: 2018-05-22

## 2018-05-13 MED ORDER — MAGNESIUM HYDROXIDE 2,400 MG/10 ML PO SUSP
10 mL | ORAL | 0 refills | Status: DC | PRN
Start: 2018-05-13 — End: 2018-05-23
  Administered 2018-05-16 (×2): 10 mL via ORAL

## 2018-05-14 ENCOUNTER — Encounter: Admit: 2018-05-14 | Discharge: 2018-05-14 | Payer: MEDICARE

## 2018-05-14 LAB — CBC CELLULAR THERAPEUTICS: Lab: 10 K/UL — ABNORMAL HIGH (ref 4.5–11.0)

## 2018-05-14 LAB — BASIC METABOLIC PANEL CELLULAR THERAPEUTICS: Lab: 139 MMOL/L — ABNORMAL LOW (ref >60)

## 2018-05-14 MED ORDER — POLYETHYLENE GLYCOL 3350 17 GRAM PO PWPK
1 | Freq: Every day | ORAL | 0 refills | Status: DC
Start: 2018-05-14 — End: 2018-05-18
  Administered 2018-05-14 – 2018-05-17 (×4): 17 g via ORAL

## 2018-05-15 LAB — BASIC METABOLIC PANEL CELLULAR THERAPEUTICS: Lab: 140 MMOL/L — ABNORMAL LOW (ref 137–147)

## 2018-05-15 LAB — CBC CELLULAR THERAPEUTICS: Lab: 9.6 K/UL — ABNORMAL LOW (ref >60)

## 2018-05-18 LAB — CBC CELLULAR THERAPEUTICS: Lab: 9.4 K/UL — ABNORMAL HIGH (ref >60)

## 2018-05-18 LAB — BASIC METABOLIC PANEL CELLULAR THERAPEUTICS: Lab: 139 MMOL/L — ABNORMAL LOW (ref >60)

## 2018-05-18 MED ORDER — SENNOSIDES 8.6 MG PO TAB
2 | Freq: Every evening | ORAL | 0 refills | Status: DC | PRN
Start: 2018-05-18 — End: 2018-05-23

## 2018-05-18 MED ORDER — DOCUSATE SODIUM 100 MG PO CAP
100 mg | Freq: Two times a day (BID) | ORAL | 0 refills | Status: DC | PRN
Start: 2018-05-18 — End: 2018-05-23

## 2018-05-18 MED ORDER — HYDROCORTISONE 2.5 % TP CRPE
RECTAL | 0 refills | Status: DC | PRN
Start: 2018-05-18 — End: 2018-05-23

## 2018-05-19 ENCOUNTER — Encounter: Admit: 2018-05-19 | Discharge: 2018-05-19 | Payer: MEDICARE

## 2018-05-19 DIAGNOSIS — S060X0S Concussion without loss of consciousness, sequela: ICD-10-CM

## 2018-05-20 LAB — BASIC METABOLIC PANEL CELLULAR THERAPEUTICS: Lab: 142 MMOL/L — ABNORMAL LOW (ref 137–147)

## 2018-05-20 LAB — CBC CELLULAR THERAPEUTICS: Lab: 7.7 K/UL — ABNORMAL LOW (ref 4.5–11.0)

## 2018-05-21 MED ORDER — ONDANSETRON HCL 4 MG PO TAB
4 mg | ORAL_TABLET | ORAL | 0 refills | Status: CN | PRN
Start: 2018-05-21 — End: ?

## 2018-05-22 LAB — BASIC METABOLIC PANEL CELLULAR THERAPEUTICS: Lab: 141 MMOL/L — ABNORMAL LOW (ref 137–147)

## 2018-05-22 LAB — CBC CELLULAR THERAPEUTICS: Lab: 6.4 K/UL — ABNORMAL LOW (ref 4.5–11.0)

## 2018-05-22 MED ORDER — ASPIRIN 81 MG PO TBEC
81 mg | ORAL_TABLET | Freq: Every day | ORAL | 0 refills | Status: AC
Start: 2018-05-22 — End: ?

## 2018-05-22 MED ORDER — ATORVASTATIN 40 MG PO TAB
40 mg | ORAL_TABLET | Freq: Every evening | ORAL | 1 refills | Status: AC
Start: 2018-05-22 — End: ?

## 2018-05-22 MED ORDER — ACETAMINOPHEN 325 MG PO TAB
650 mg | ORAL | 0 refills | Status: AC | PRN
Start: 2018-05-22 — End: ?

## 2018-05-22 MED ORDER — TAMSULOSIN 0.4 MG PO CAP
0.4 mg | ORAL_CAPSULE | Freq: Every day | ORAL | 1 refills | 90.00000 days | Status: AC
Start: 2018-05-22 — End: ?

## 2018-05-22 MED ORDER — SENNOSIDES-DOCUSATE SODIUM 8.6-50 MG PO TAB
2 | Freq: Two times a day (BID) | ORAL | 0 refills | Status: AC
Start: 2018-05-22 — End: ?

## 2018-05-22 MED ORDER — FAMOTIDINE 20 MG PO TAB
20 mg | ORAL_TABLET | Freq: Two times a day (BID) | ORAL | 0 refills | 90.00000 days | Status: AC
Start: 2018-05-22 — End: ?

## 2018-05-23 ENCOUNTER — Inpatient Hospital Stay: Admit: 2018-05-19 | Discharge: 2018-05-19 | Payer: MEDICARE

## 2018-05-23 DIAGNOSIS — G8911 Acute pain due to trauma: ICD-10-CM

## 2018-05-23 DIAGNOSIS — E785 Hyperlipidemia, unspecified: ICD-10-CM

## 2018-05-23 DIAGNOSIS — R2689 Other abnormalities of gait and mobility: ICD-10-CM

## 2018-05-23 DIAGNOSIS — F17210 Nicotine dependence, cigarettes, uncomplicated: ICD-10-CM

## 2018-05-23 DIAGNOSIS — S060X0D Concussion without loss of consciousness, subsequent encounter: Principal | ICD-10-CM

## 2018-05-23 DIAGNOSIS — D72829 Elevated white blood cell count, unspecified: ICD-10-CM

## 2018-05-23 DIAGNOSIS — G479 Sleep disorder, unspecified: ICD-10-CM

## 2018-05-23 DIAGNOSIS — R5381 Other malaise: ICD-10-CM

## 2018-05-23 DIAGNOSIS — Z48812 Encounter for surgical aftercare following surgery on the circulatory system: ICD-10-CM

## 2018-05-23 DIAGNOSIS — Z79899 Other long term (current) drug therapy: ICD-10-CM

## 2018-05-23 DIAGNOSIS — H538 Other visual disturbances: ICD-10-CM

## 2018-05-23 DIAGNOSIS — S020XXD Fracture of vault of skull, subsequent encounter for fracture with routine healing: ICD-10-CM

## 2018-05-23 DIAGNOSIS — N400 Enlarged prostate without lower urinary tract symptoms: ICD-10-CM

## 2018-05-23 DIAGNOSIS — D62 Acute posthemorrhagic anemia: ICD-10-CM

## 2018-05-23 DIAGNOSIS — R2681 Unsteadiness on feet: ICD-10-CM

## 2018-05-23 DIAGNOSIS — M79603 Pain in arm, unspecified: ICD-10-CM

## 2018-05-23 DIAGNOSIS — R5383 Other fatigue: ICD-10-CM

## 2018-05-23 DIAGNOSIS — M353 Polymyalgia rheumatica: ICD-10-CM

## 2018-05-23 DIAGNOSIS — Z7982 Long term (current) use of aspirin: ICD-10-CM

## 2018-05-23 DIAGNOSIS — G3184 Mild cognitive impairment, so stated: ICD-10-CM

## 2018-05-23 DIAGNOSIS — G8918 Other acute postprocedural pain: ICD-10-CM

## 2018-05-23 DIAGNOSIS — Z8679 Personal history of other diseases of the circulatory system: ICD-10-CM

## 2018-05-23 DIAGNOSIS — R4189 Other symptoms and signs involving cognitive functions and awareness: ICD-10-CM

## 2018-05-25 ENCOUNTER — Encounter: Admit: 2018-05-25 | Discharge: 2018-05-25 | Payer: MEDICARE

## 2018-06-05 ENCOUNTER — Encounter: Admit: 2018-06-05 | Discharge: 2018-06-05 | Payer: MEDICARE

## 2018-06-05 ENCOUNTER — Ambulatory Visit: Admit: 2018-06-05 | Discharge: 2018-06-06 | Payer: MEDICARE

## 2018-06-05 DIAGNOSIS — M1990 Unspecified osteoarthritis, unspecified site: Secondary | ICD-10-CM

## 2018-06-05 DIAGNOSIS — I82409 Acute embolism and thrombosis of unspecified deep veins of unspecified lower extremity: Secondary | ICD-10-CM

## 2018-06-05 DIAGNOSIS — M5030 Other cervical disc degeneration, unspecified cervical region: Secondary | ICD-10-CM

## 2018-06-05 DIAGNOSIS — E785 Hyperlipidemia, unspecified: Secondary | ICD-10-CM

## 2018-06-05 DIAGNOSIS — S0993XA Unspecified injury of face, initial encounter: Secondary | ICD-10-CM

## 2018-06-05 DIAGNOSIS — I714 Abdominal aortic aneurysm, without rupture: Secondary | ICD-10-CM

## 2018-06-05 DIAGNOSIS — I8393 Asymptomatic varicose veins of bilateral lower extremities: Secondary | ICD-10-CM

## 2018-06-05 DIAGNOSIS — M353 Polymyalgia rheumatica: Secondary | ICD-10-CM

## 2018-06-05 DIAGNOSIS — N400 Enlarged prostate without lower urinary tract symptoms: Secondary | ICD-10-CM

## 2018-06-05 DIAGNOSIS — H269 Unspecified cataract: Secondary | ICD-10-CM

## 2018-06-05 DIAGNOSIS — I6523 Occlusion and stenosis of bilateral carotid arteries: Secondary | ICD-10-CM

## 2018-06-24 ENCOUNTER — Ambulatory Visit: Admit: 2018-06-24 | Discharge: 2018-06-24 | Payer: Commercial Managed Care - HMO

## 2018-06-24 ENCOUNTER — Ambulatory Visit: Admit: 2018-06-24 | Discharge: 2018-06-24 | Payer: MEDICARE

## 2018-06-24 ENCOUNTER — Encounter: Admit: 2018-06-24 | Discharge: 2018-06-24 | Payer: MEDICARE

## 2018-06-24 DIAGNOSIS — Z8679 Personal history of other diseases of the circulatory system: Secondary | ICD-10-CM

## 2018-06-24 DIAGNOSIS — I6523 Occlusion and stenosis of bilateral carotid arteries: Secondary | ICD-10-CM

## 2018-06-24 DIAGNOSIS — S069X0D Unspecified intracranial injury without loss of consciousness, subsequent encounter: Secondary | ICD-10-CM

## 2018-06-24 DIAGNOSIS — M353 Polymyalgia rheumatica: Secondary | ICD-10-CM

## 2018-06-24 DIAGNOSIS — H547 Unspecified visual loss: Secondary | ICD-10-CM

## 2018-06-24 DIAGNOSIS — H4912 Fourth [trochlear] nerve palsy, left eye: Secondary | ICD-10-CM

## 2018-06-24 DIAGNOSIS — R2689 Other abnormalities of gait and mobility: Secondary | ICD-10-CM

## 2018-06-24 DIAGNOSIS — M1990 Unspecified osteoarthritis, unspecified site: Secondary | ICD-10-CM

## 2018-06-24 DIAGNOSIS — I714 Abdominal aortic aneurysm, without rupture: Secondary | ICD-10-CM

## 2018-06-24 DIAGNOSIS — I8393 Asymptomatic varicose veins of bilateral lower extremities: Secondary | ICD-10-CM

## 2018-06-24 DIAGNOSIS — E785 Hyperlipidemia, unspecified: Secondary | ICD-10-CM

## 2018-06-24 DIAGNOSIS — M5030 Other cervical disc degeneration, unspecified cervical region: Secondary | ICD-10-CM

## 2018-06-24 DIAGNOSIS — I82409 Acute embolism and thrombosis of unspecified deep veins of unspecified lower extremity: Secondary | ICD-10-CM

## 2018-06-24 DIAGNOSIS — Z9889 Other specified postprocedural states: Secondary | ICD-10-CM

## 2018-06-24 DIAGNOSIS — H269 Unspecified cataract: Secondary | ICD-10-CM

## 2018-06-24 DIAGNOSIS — H469 Unspecified optic neuritis: Secondary | ICD-10-CM

## 2018-06-24 DIAGNOSIS — I890 Lymphedema, not elsewhere classified: ICD-10-CM

## 2018-06-24 DIAGNOSIS — R4189 Other symptoms and signs involving cognitive functions and awareness: Secondary | ICD-10-CM

## 2018-06-24 DIAGNOSIS — N400 Enlarged prostate without lower urinary tract symptoms: Secondary | ICD-10-CM

## 2018-06-24 DIAGNOSIS — H26492 Other secondary cataract, left eye: Secondary | ICD-10-CM

## 2018-06-24 LAB — POC CREATININE, RAD: Lab: 1 mg/dL (ref 0.4–1.24)

## 2018-06-24 MED ORDER — SODIUM CHLORIDE 0.9 % IJ SOLN
50 mL | Freq: Once | INTRAVENOUS | 0 refills | Status: CP
Start: 2018-06-24 — End: ?
  Administered 2018-06-24: 17:00:00 50 mL via INTRAVENOUS

## 2018-06-24 MED ORDER — IOHEXOL 350 MG IODINE/ML IV SOLN
100 mL | Freq: Once | INTRAVENOUS | 0 refills | Status: CP
Start: 2018-06-24 — End: ?
  Administered 2018-06-24: 17:00:00 100 mL via INTRAVENOUS

## 2018-06-24 MED ORDER — NYSTATIN 100,000 UNIT/GRAM TP POWD
Freq: Once | TOPICAL | 0 refills | Status: CP
Start: 2018-06-24 — End: ?
  Administered 2018-06-24: 16:00:00 via TOPICAL

## 2018-06-25 ENCOUNTER — Encounter: Admit: 2018-06-25 | Discharge: 2018-06-25 | Payer: MEDICARE

## 2018-06-25 DIAGNOSIS — H26491 Other secondary cataract, right eye: Secondary | ICD-10-CM

## 2018-06-26 ENCOUNTER — Encounter: Admit: 2018-06-26 | Discharge: 2018-06-26 | Payer: MEDICARE

## 2018-06-26 DIAGNOSIS — N400 Enlarged prostate without lower urinary tract symptoms: Secondary | ICD-10-CM

## 2018-06-26 DIAGNOSIS — M353 Polymyalgia rheumatica: Secondary | ICD-10-CM

## 2018-06-26 DIAGNOSIS — I714 Abdominal aortic aneurysm, without rupture: Secondary | ICD-10-CM

## 2018-06-26 DIAGNOSIS — I82409 Acute embolism and thrombosis of unspecified deep veins of unspecified lower extremity: Secondary | ICD-10-CM

## 2018-06-26 DIAGNOSIS — I8393 Asymptomatic varicose veins of bilateral lower extremities: Secondary | ICD-10-CM

## 2018-06-26 DIAGNOSIS — I6523 Occlusion and stenosis of bilateral carotid arteries: Secondary | ICD-10-CM

## 2018-06-26 DIAGNOSIS — E785 Hyperlipidemia, unspecified: Secondary | ICD-10-CM

## 2018-06-26 DIAGNOSIS — M5030 Other cervical disc degeneration, unspecified cervical region: Secondary | ICD-10-CM

## 2018-06-26 DIAGNOSIS — M1990 Unspecified osteoarthritis, unspecified site: Secondary | ICD-10-CM

## 2018-07-27 ENCOUNTER — Encounter: Admit: 2018-07-27 | Discharge: 2018-07-27 | Payer: MEDICARE

## 2018-07-28 ENCOUNTER — Encounter: Admit: 2018-07-28 | Discharge: 2018-07-28 | Payer: MEDICARE

## 2018-07-28 ENCOUNTER — Ambulatory Visit: Admit: 2018-07-28 | Discharge: 2018-07-28 | Payer: MEDICARE

## 2018-07-28 DIAGNOSIS — E785 Hyperlipidemia, unspecified: ICD-10-CM

## 2018-07-28 DIAGNOSIS — Z9842 Cataract extraction status, left eye: Secondary | ICD-10-CM

## 2018-07-28 DIAGNOSIS — M353 Polymyalgia rheumatica: Secondary | ICD-10-CM

## 2018-07-28 DIAGNOSIS — M1990 Unspecified osteoarthritis, unspecified site: ICD-10-CM

## 2018-07-28 DIAGNOSIS — I714 Abdominal aortic aneurysm, without rupture: ICD-10-CM

## 2018-07-28 DIAGNOSIS — I82409 Acute embolism and thrombosis of unspecified deep veins of unspecified lower extremity: ICD-10-CM

## 2018-07-28 DIAGNOSIS — I6523 Occlusion and stenosis of bilateral carotid arteries: ICD-10-CM

## 2018-07-28 DIAGNOSIS — I8393 Asymptomatic varicose veins of bilateral lower extremities: ICD-10-CM

## 2018-07-28 DIAGNOSIS — N400 Enlarged prostate without lower urinary tract symptoms: Secondary | ICD-10-CM

## 2018-07-28 DIAGNOSIS — Z86718 Personal history of other venous thrombosis and embolism: Secondary | ICD-10-CM

## 2018-07-28 DIAGNOSIS — Z9841 Cataract extraction status, right eye: Secondary | ICD-10-CM

## 2018-07-28 DIAGNOSIS — Z8679 Personal history of other diseases of the circulatory system: Secondary | ICD-10-CM

## 2018-07-28 DIAGNOSIS — S060X9A Concussion with loss of consciousness of unspecified duration, initial encounter: ICD-10-CM

## 2018-07-28 DIAGNOSIS — Z7982 Long term (current) use of aspirin: Secondary | ICD-10-CM

## 2018-07-28 DIAGNOSIS — H26492 Other secondary cataract, left eye: Secondary | ICD-10-CM

## 2018-07-28 DIAGNOSIS — Z9889 Other specified postprocedural states: Secondary | ICD-10-CM

## 2018-07-28 DIAGNOSIS — Z87891 Personal history of nicotine dependence: Secondary | ICD-10-CM

## 2018-07-28 DIAGNOSIS — Z79899 Other long term (current) drug therapy: Secondary | ICD-10-CM

## 2018-07-28 DIAGNOSIS — M5030 Other cervical disc degeneration, unspecified cervical region: ICD-10-CM

## 2018-07-28 MED ORDER — APRACLONIDINE 0.5 % OP DROP
1 [drp] | Freq: Once | OPHTHALMIC | 0 refills | Status: CP
Start: 2018-07-28 — End: ?
  Administered 2018-07-28: 18:00:00 1 [drp] via OPHTHALMIC

## 2018-07-28 MED ORDER — PHENYLEPHRINE HCL 2.5 % OP DROP
1 [drp] | Freq: Once | OPHTHALMIC | 0 refills | Status: CP
Start: 2018-07-28 — End: ?
  Administered 2018-07-28: 18:00:00 1 [drp] via OPHTHALMIC

## 2018-07-28 MED ORDER — TETRACAINE HCL (PF) 0.5 % OP DROP
0 refills | Status: DC
Start: 2018-07-28 — End: 2018-07-28
  Administered 2018-07-28: 18:00:00 1 [drp] via OPHTHALMIC

## 2018-07-28 MED ORDER — TROPICAMIDE 1 % OP DROP
1 [drp] | Freq: Once | OPHTHALMIC | 0 refills | Status: CP
Start: 2018-07-28 — End: ?
  Administered 2018-07-28: 18:00:00 1 [drp] via OPHTHALMIC

## 2018-07-28 MED ORDER — TETRACAINE HCL (PF) 0.5 % OP DROP
1 [drp] | OPHTHALMIC | 0 refills | Status: CP
Start: 2018-07-28 — End: ?
  Administered 2018-07-28: 18:00:00 1 [drp] via OPHTHALMIC

## 2018-07-28 MED ORDER — HYPROMELLOSE 2.5 % OP DROP
0 refills | Status: DC
Start: 2018-07-28 — End: 2018-07-28
  Administered 2018-07-28: 18:00:00 1 [drp] via OPHTHALMIC

## 2018-07-31 ENCOUNTER — Encounter: Admit: 2018-07-31 | Discharge: 2018-07-31 | Payer: MEDICARE

## 2018-07-31 DIAGNOSIS — I6523 Occlusion and stenosis of bilateral carotid arteries: ICD-10-CM

## 2018-07-31 DIAGNOSIS — I714 Abdominal aortic aneurysm, without rupture: ICD-10-CM

## 2018-07-31 DIAGNOSIS — N400 Enlarged prostate without lower urinary tract symptoms: Principal | ICD-10-CM

## 2018-07-31 DIAGNOSIS — E785 Hyperlipidemia, unspecified: ICD-10-CM

## 2018-07-31 DIAGNOSIS — I8393 Asymptomatic varicose veins of bilateral lower extremities: ICD-10-CM

## 2018-07-31 DIAGNOSIS — M5030 Other cervical disc degeneration, unspecified cervical region: ICD-10-CM

## 2018-07-31 DIAGNOSIS — I82409 Acute embolism and thrombosis of unspecified deep veins of unspecified lower extremity: ICD-10-CM

## 2018-07-31 DIAGNOSIS — M1990 Unspecified osteoarthritis, unspecified site: ICD-10-CM

## 2018-07-31 DIAGNOSIS — S060X9A Concussion with loss of consciousness of unspecified duration, initial encounter: ICD-10-CM

## 2018-07-31 DIAGNOSIS — M353 Polymyalgia rheumatica: ICD-10-CM

## 2018-08-10 ENCOUNTER — Encounter: Admit: 2018-08-10 | Discharge: 2018-08-10 | Payer: MEDICARE

## 2018-08-11 ENCOUNTER — Encounter: Admit: 2018-08-11 | Discharge: 2018-08-11 | Payer: MEDICARE

## 2018-08-11 ENCOUNTER — Ambulatory Visit: Admit: 2018-08-11 | Discharge: 2018-08-11 | Payer: MEDICARE

## 2018-08-11 DIAGNOSIS — E785 Hyperlipidemia, unspecified: Secondary | ICD-10-CM

## 2018-08-11 DIAGNOSIS — S060X9A Concussion with loss of consciousness of unspecified duration, initial encounter: ICD-10-CM

## 2018-08-11 DIAGNOSIS — M1990 Unspecified osteoarthritis, unspecified site: ICD-10-CM

## 2018-08-11 DIAGNOSIS — I714 Abdominal aortic aneurysm, without rupture: ICD-10-CM

## 2018-08-11 DIAGNOSIS — M5030 Other cervical disc degeneration, unspecified cervical region: ICD-10-CM

## 2018-08-11 DIAGNOSIS — I82409 Acute embolism and thrombosis of unspecified deep veins of unspecified lower extremity: ICD-10-CM

## 2018-08-11 DIAGNOSIS — I6523 Occlusion and stenosis of bilateral carotid arteries: ICD-10-CM

## 2018-08-11 DIAGNOSIS — Z86718 Personal history of other venous thrombosis and embolism: Secondary | ICD-10-CM

## 2018-08-11 DIAGNOSIS — Z87891 Personal history of nicotine dependence: Secondary | ICD-10-CM

## 2018-08-11 DIAGNOSIS — Z8249 Family history of ischemic heart disease and other diseases of the circulatory system: Secondary | ICD-10-CM

## 2018-08-11 DIAGNOSIS — H26491 Other secondary cataract, right eye: Secondary | ICD-10-CM

## 2018-08-11 DIAGNOSIS — N400 Enlarged prostate without lower urinary tract symptoms: Principal | ICD-10-CM

## 2018-08-11 DIAGNOSIS — Z7982 Long term (current) use of aspirin: Secondary | ICD-10-CM

## 2018-08-11 DIAGNOSIS — I8393 Asymptomatic varicose veins of bilateral lower extremities: ICD-10-CM

## 2018-08-11 DIAGNOSIS — M353 Polymyalgia rheumatica: ICD-10-CM

## 2018-08-11 DIAGNOSIS — H538 Other visual disturbances: Secondary | ICD-10-CM

## 2018-08-11 MED ORDER — PHENYLEPHRINE HCL 2.5 % OP DROP
1 [drp] | Freq: Once | OPHTHALMIC | 0 refills | Status: CP
Start: 2018-08-11 — End: ?
  Administered 2018-08-11: 15:00:00 1 [drp] via OPHTHALMIC

## 2018-08-11 MED ORDER — OTHER MEDICATION
0 refills | Status: DC
Start: 2018-08-11 — End: 2018-08-11

## 2018-08-11 MED ORDER — TROPICAMIDE 1 % OP DROP
1 [drp] | Freq: Once | OPHTHALMIC | 0 refills | Status: CP
Start: 2018-08-11 — End: ?
  Administered 2018-08-11: 15:00:00 1 [drp] via OPHTHALMIC

## 2018-08-11 MED ORDER — TETRACAINE HCL (PF) 0.5 % OP DROP
1 [drp] | OPHTHALMIC | 0 refills | Status: CP
Start: 2018-08-11 — End: ?
  Administered 2018-08-11: 15:00:00 1 [drp] via OPHTHALMIC

## 2018-08-13 ENCOUNTER — Encounter: Admit: 2018-08-13 | Discharge: 2018-08-13 | Payer: MEDICARE

## 2018-08-13 DIAGNOSIS — M1990 Unspecified osteoarthritis, unspecified site: ICD-10-CM

## 2018-08-13 DIAGNOSIS — N400 Enlarged prostate without lower urinary tract symptoms: Principal | ICD-10-CM

## 2018-08-13 DIAGNOSIS — S060X9A Concussion with loss of consciousness of unspecified duration, initial encounter: ICD-10-CM

## 2018-08-13 DIAGNOSIS — M5030 Other cervical disc degeneration, unspecified cervical region: ICD-10-CM

## 2018-08-13 DIAGNOSIS — E785 Hyperlipidemia, unspecified: ICD-10-CM

## 2018-08-13 DIAGNOSIS — M353 Polymyalgia rheumatica: ICD-10-CM

## 2018-08-13 DIAGNOSIS — I8393 Asymptomatic varicose veins of bilateral lower extremities: ICD-10-CM

## 2018-08-13 DIAGNOSIS — I82409 Acute embolism and thrombosis of unspecified deep veins of unspecified lower extremity: ICD-10-CM

## 2018-08-13 DIAGNOSIS — I714 Abdominal aortic aneurysm, without rupture: ICD-10-CM

## 2018-08-13 DIAGNOSIS — I6523 Occlusion and stenosis of bilateral carotid arteries: ICD-10-CM

## 2018-08-13 NOTE — Telephone Encounter
Katherine from [employer] called to have patients doctor or nurse to call them back @ [phone] or email her @ [email or web address] about patient.

## 2018-08-20 ENCOUNTER — Encounter: Admit: 2018-08-20 | Discharge: 2018-08-20 | Payer: MEDICARE

## 2018-08-24 ENCOUNTER — Encounter: Admit: 2018-08-24 | Discharge: 2018-08-24 | Payer: MEDICARE

## 2018-08-24 NOTE — Telephone Encounter
Marisue Ivan called in regards to patients appt, due to patients age and him traveling so far he would like to cxl appt and see one of his eye doctors closer to him. Routing msg to Dr Sharlette Dense to inform her of this per patient request.

## 2018-08-26 ENCOUNTER — Encounter: Admit: 2018-08-26 | Discharge: 2018-08-26 | Payer: MEDICARE

## 2018-09-23 ENCOUNTER — Encounter: Admit: 2018-09-23 | Discharge: 2018-09-23 | Payer: MEDICARE

## 2018-10-12 ENCOUNTER — Encounter: Admit: 2018-10-12 | Discharge: 2018-10-12 | Payer: MEDICARE

## 2018-10-13 ENCOUNTER — Encounter: Admit: 2018-10-13 | Discharge: 2018-10-13 | Payer: MEDICARE

## 2018-10-21 ENCOUNTER — Ambulatory Visit: Admit: 2018-10-21 | Discharge: 2018-10-22 | Payer: MEDICARE

## 2018-10-21 ENCOUNTER — Encounter: Admit: 2018-10-21 | Discharge: 2018-10-21 | Payer: MEDICARE

## 2018-10-21 DIAGNOSIS — M1990 Unspecified osteoarthritis, unspecified site: ICD-10-CM

## 2018-10-21 DIAGNOSIS — N400 Enlarged prostate without lower urinary tract symptoms: Principal | ICD-10-CM

## 2018-10-21 DIAGNOSIS — M5030 Other cervical disc degeneration, unspecified cervical region: ICD-10-CM

## 2018-10-21 DIAGNOSIS — S060X9A Concussion with loss of consciousness of unspecified duration, initial encounter: ICD-10-CM

## 2018-10-21 DIAGNOSIS — I8393 Asymptomatic varicose veins of bilateral lower extremities: ICD-10-CM

## 2018-10-21 DIAGNOSIS — I714 Abdominal aortic aneurysm, without rupture: ICD-10-CM

## 2018-10-21 DIAGNOSIS — M353 Polymyalgia rheumatica: ICD-10-CM

## 2018-10-21 DIAGNOSIS — I82409 Acute embolism and thrombosis of unspecified deep veins of unspecified lower extremity: ICD-10-CM

## 2018-10-21 DIAGNOSIS — I6523 Occlusion and stenosis of bilateral carotid arteries: ICD-10-CM

## 2018-10-21 DIAGNOSIS — E785 Hyperlipidemia, unspecified: ICD-10-CM

## 2018-10-22 DIAGNOSIS — H353112 Nonexudative age-related macular degeneration, right eye, intermediate dry stage: Principal | ICD-10-CM

## 2018-12-30 ENCOUNTER — Encounter: Admit: 2018-12-30 | Discharge: 2018-12-30

## 2018-12-30 DIAGNOSIS — I8393 Asymptomatic varicose veins of bilateral lower extremities: Secondary | ICD-10-CM

## 2018-12-30 DIAGNOSIS — I82409 Acute embolism and thrombosis of unspecified deep veins of unspecified lower extremity: Secondary | ICD-10-CM

## 2018-12-30 DIAGNOSIS — E785 Hyperlipidemia, unspecified: Secondary | ICD-10-CM

## 2018-12-30 DIAGNOSIS — I714 Abdominal aortic aneurysm, without rupture: Secondary | ICD-10-CM

## 2018-12-30 DIAGNOSIS — M353 Polymyalgia rheumatica: Secondary | ICD-10-CM

## 2018-12-30 DIAGNOSIS — H353112 Nonexudative age-related macular degeneration, right eye, intermediate dry stage: Principal | ICD-10-CM

## 2018-12-30 DIAGNOSIS — M1990 Unspecified osteoarthritis, unspecified site: Secondary | ICD-10-CM

## 2018-12-30 DIAGNOSIS — I6523 Occlusion and stenosis of bilateral carotid arteries: Secondary | ICD-10-CM

## 2018-12-30 DIAGNOSIS — N400 Enlarged prostate without lower urinary tract symptoms: Secondary | ICD-10-CM

## 2018-12-30 DIAGNOSIS — H4912 Fourth [trochlear] nerve palsy, left eye: Secondary | ICD-10-CM

## 2018-12-30 DIAGNOSIS — S060X9A Concussion with loss of consciousness of unspecified duration, initial encounter: Secondary | ICD-10-CM

## 2018-12-30 DIAGNOSIS — M5030 Other cervical disc degeneration, unspecified cervical region: Secondary | ICD-10-CM

## 2018-12-30 NOTE — Progress Notes
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 Amsler grid given    Instructions reviewed with pt and daughter    Fourth nerve palsy, left  Function VERY well with prism in glasses - pt is back to driving. Daughter states he has made enormous progress and they are very happy.           Return in about 6 months (around 07/02/2019) for Dilated exam, OCT mac.    Corky Sox, MD  [facility]      HPI:  Patient presents with:  Eye Problem: FU; taking an eye vitamin po. No double va noticed. Has noticed first thing in the morning OS has a haze over it and takes wearing glasses for a while. Pt has noticed vision has improved with glasses.    Pt says double vision is resolved with prism.    Pt has been driving since he got the prism. He has felt very comfortable driving and daughter feels comfortable.        Exam:  Base Eye Exam     Visual Acuity (Snellen - Linear)       Right Left    Dist cc 20/30 +2 20/25 -1    Correction:  Glasses          Tonometry ([facility] Tonometer, 3:07 PM)       Right Left    Pressure 10 10          Pupils       Pupils APD    Right PERRL None    Left PERRL None          Visual Fields       Left Right     Full Full          Extraocular Movement       Right Left     Full Full          Neuro/Psych     Oriented x3:  Yes    Mood/Affect:  Normal            Slit Lamp and Fundus Exam     Slit Lamp Exam       Right Left    Lids/Lashes Normal Normal    Conjunctiva/Sclera trace injection with conj chalasis trace injection with conj chalasis    Cornea Clear Clear    Anterior Chamber Deep and quiet Deep and quiet    Iris Flat Flat    Lens WC PCIOL with open posterior capsule WC PCIOL with open posterior capsule            Refraction     Wearing Rx       Sphere Cylinder Axis Add Vert Prism Right -1.75 +0.75 023 +2.75 3.5UP up    Left -1.75 +2.00 157 +2.50 3.5DN    Age:  new    Type:  Trifocal

## 2018-12-30 NOTE — Assessment & Plan Note
Function VERY well with prism in glasses - pt is back to driving. Daughter states he has made enormous progress and they are very happy.

## 2018-12-30 NOTE — Assessment & Plan Note
Cont AREDS-2. Amsler grid given    Instructions reviewed with pt and daughter

## 2018-12-30 NOTE — Patient Instructions
Recommend AREDS-2 vitamin supplementation.  (Ocuvite, Preservision, I-CAPS)  Also recommend regular use of Amsler Grid.  Call immediately if any changes  Education provided.

## 2018-12-31 ENCOUNTER — Ambulatory Visit: Admit: 2018-12-30 | Discharge: 2018-12-31

## 2019-07-02 ENCOUNTER — Encounter: Admit: 2019-07-02 | Discharge: 2019-07-02 | Payer: MEDICARE

## 2020-09-11 NOTE — Patient Instructions
Raul Winterhalter MSN, RN  Clinical Nurse Coordinator  Dr. Douglas Burton, M.D.  [facility]  [place], [place] [address]  Nurse Phone Number: [phone] Fax [phone]  Appointment line [phone]      It was great to see you today. Thank you for choosing to visit [facility].     Your time is important and if you had to wait today, we do apologize. Our goal is to be prompt; however, on occasion, we experience delays during clinic due to unexpected patient needs. We thank you for your patience.     General Instructions:   How to reach me: Please send a MyChart message or leave a voicemail on Dr Burton's nurse line at [phone].   Scheduling: Our scheduling phone number is [phone].   If you choose to have a test or procedure outside of [place] please contact us with the fax number of your chose provider so we can fax your referral to them if necessary.  We do not prior authorize testing or procedures performed outside of .     How to receive your test results: If you have signed up for MyChart, results are typically viewable within 48 hours. Please note that imaging results will be reviewed during a follow-up visit.   How to get a medication refill: Please use MyChart Refill request or contact your pharmacy directly to request medication refills. We require 2 business days notice for refills of controlled substances.   Appointment Reminders on your cell phone: Make sure we have your cell phone number, and Text [place] to [phone].   Support for many chronic illnesses is available through [facility]: [email or web address] or [phone].   For urgent questions on nights, weekends or holidays, call the Operator at [phone], and ask for the doctor on call for Orthopedic Spine Surgery.     Thank-you for your visiting with us today!

## 2020-09-12 ENCOUNTER — Encounter: Admit: 2020-09-12 | Discharge: 2020-09-12 | Payer: MEDICARE

## 2020-09-12 ENCOUNTER — Ambulatory Visit: Admit: 2020-09-12 | Discharge: 2020-09-12 | Payer: MEDICARE

## 2020-09-12 DIAGNOSIS — S060X9A Concussion with loss of consciousness of unspecified duration, initial encounter: Secondary | ICD-10-CM

## 2020-09-12 DIAGNOSIS — M5030 Other cervical disc degeneration, unspecified cervical region: Secondary | ICD-10-CM

## 2020-09-12 DIAGNOSIS — M48062 Spinal stenosis, lumbar region with neurogenic claudication: Secondary | ICD-10-CM

## 2020-09-12 DIAGNOSIS — I6523 Occlusion and stenosis of bilateral carotid arteries: Secondary | ICD-10-CM

## 2020-09-12 DIAGNOSIS — N400 Enlarged prostate without lower urinary tract symptoms: Secondary | ICD-10-CM

## 2020-09-12 DIAGNOSIS — I8393 Asymptomatic varicose veins of bilateral lower extremities: Secondary | ICD-10-CM

## 2020-09-12 DIAGNOSIS — M1990 Unspecified osteoarthritis, unspecified site: Secondary | ICD-10-CM

## 2020-09-12 DIAGNOSIS — I714 Abdominal aortic aneurysm, without rupture: Secondary | ICD-10-CM

## 2020-09-12 DIAGNOSIS — M5440 Lumbago with sciatica, unspecified side: Secondary | ICD-10-CM

## 2020-09-12 DIAGNOSIS — I82409 Acute embolism and thrombosis of unspecified deep veins of unspecified lower extremity: Secondary | ICD-10-CM

## 2020-09-12 DIAGNOSIS — M353 Polymyalgia rheumatica: Secondary | ICD-10-CM

## 2020-09-12 DIAGNOSIS — E785 Hyperlipidemia, unspecified: Secondary | ICD-10-CM

## 2020-09-12 NOTE — Progress Notes
SPINE CENTER CLINIC NOTE        Dictation on: 09/12/2020  1:35 PM by: Lillia Pauls    Dictation on: 09/12/2020  2:01 PM by: Charlesetta Garibaldi     I personally performed the key portions of the E/M visit, discussed case with resident/PA and concur with resident/PA documentation of history, physical exam, assessment, and treatment plan unless otherwise noted.        I   Review of Systems  Current Outpatient Medications on File Prior to Visit   Medication Sig Dispense Refill    acetaminophen (TYLENOL) 325 mg tablet Take two tablets by mouth every 4 hours as needed.      aspirin EC 81 mg tablet Take one tablet by mouth daily. Take with food. 90 tablet     atorvastatin (LIPITOR) 40 mg tablet Take one tablet by mouth at bedtime daily. 30 tablet 1    escitalopram oxalate (LEXAPRO) 5 mg tablet Take 5 mg by mouth daily.      famotidine (PEPCID) 20 mg tablet Take one tablet by mouth twice daily. Indications: stress ulcer prevention 180 tablet     senna/docusate (SENOKOT-S) 8.6/50 mg tablet Take two tablets by mouth twice daily. Indications: constipation      tamsulosin (FLOMAX) 0.4 mg capsule Take one capsule by mouth daily after breakfast. Do not crush, chew or open capsules. Take 30 minutes following the same meal each day. 30 capsule 1    vit C/E/Zn/coppr/lutein/zeaxan (PRESERVISION AREDS-2 PO) Take 1 tablet by mouth twice daily.       No current facility-administered medications on file prior to visit.     No Known Allergies  Vitals:    09/12/20 1311   BP: 138/63   Pulse: 83   SpO2: 100%   Weight: 77.1 kg (170 lb)   Height: 170.2 cm (5\' 7" )   PainSc: Eight        Pain Score: Eight  Body mass index is 26.63 kg/m.

## 2020-09-14 ENCOUNTER — Encounter: Admit: 2020-09-14 | Discharge: 2020-09-14 | Payer: MEDICARE

## 2020-09-15 ENCOUNTER — Encounter: Admit: 2020-09-15 | Discharge: 2020-09-15 | Payer: MEDICARE

## 2020-09-15 ENCOUNTER — Ambulatory Visit: Admit: 2020-09-15 | Discharge: 2020-09-15 | Payer: MEDICARE

## 2020-09-15 DIAGNOSIS — M353 Polymyalgia rheumatica: Secondary | ICD-10-CM

## 2020-09-15 DIAGNOSIS — I6523 Occlusion and stenosis of bilateral carotid arteries: Secondary | ICD-10-CM

## 2020-09-15 DIAGNOSIS — M48062 Spinal stenosis, lumbar region with neurogenic claudication: Secondary | ICD-10-CM

## 2020-09-15 DIAGNOSIS — E785 Hyperlipidemia, unspecified: Secondary | ICD-10-CM

## 2020-09-15 DIAGNOSIS — I82409 Acute embolism and thrombosis of unspecified deep veins of unspecified lower extremity: Secondary | ICD-10-CM

## 2020-09-15 DIAGNOSIS — N400 Enlarged prostate without lower urinary tract symptoms: Secondary | ICD-10-CM

## 2020-09-15 DIAGNOSIS — M1990 Unspecified osteoarthritis, unspecified site: Secondary | ICD-10-CM

## 2020-09-15 DIAGNOSIS — I8393 Asymptomatic varicose veins of bilateral lower extremities: Secondary | ICD-10-CM

## 2020-09-15 DIAGNOSIS — M5416 Radiculopathy, lumbar region: Secondary | ICD-10-CM

## 2020-09-15 DIAGNOSIS — I714 Abdominal aortic aneurysm, without rupture: Secondary | ICD-10-CM

## 2020-09-15 DIAGNOSIS — M25562 Pain in left knee: Secondary | ICD-10-CM

## 2020-09-15 DIAGNOSIS — S060X9A Concussion with loss of consciousness of unspecified duration, initial encounter: Secondary | ICD-10-CM

## 2020-09-15 DIAGNOSIS — M5030 Other cervical disc degeneration, unspecified cervical region: Secondary | ICD-10-CM

## 2020-09-15 MED ORDER — METHYLPREDNISOLONE ACETATE 80 MG/ML IJ SUSP
80 mg | Freq: Once | INTRAMUSCULAR | 0 refills | Status: CP
Start: 2020-09-15 — End: ?
  Administered 2020-09-15: 17:00:00 80 mg via INTRAMUSCULAR

## 2020-09-15 MED ORDER — IOHEXOL 240 MG IODINE/ML IV SOLN
2.5 mL | Freq: Once | EPIDURAL | 0 refills | Status: CP
Start: 2020-09-15 — End: ?
  Administered 2020-09-15: 17:00:00 2.5 mL via EPIDURAL

## 2020-09-15 NOTE — Progress Notes

## 2020-09-15 NOTE — Procedures
Attending Surgeon: Bella Kennedy, MD    Anesthesia: Local    Pre-Procedure Diagnosis:   1. Lumbar radiculopathy    2. Lumbar stenosis with neurogenic claudication    3. Left knee pain, unspecified chronicity        Post-Procedure Diagnosis:   1. Lumbar radiculopathy    2. Lumbar stenosis with neurogenic claudication    3. Left knee pain, unspecified chronicity        Pain Score: One    SNR/TF LMBR/SAC  Procedure: transforaminal epidural    Laterality: right   on 09/15/2020 11:45 AM  Location: lumbar -  L4-5 and L5-S1      Consent:   Consent obtained: verbal and written  Consent given by: patient  Risks discussed: allergic reaction, bleeding, bruising, infection, nerve damage, no change or worsening in pain, weakness, swelling and reaction to medication  Alternatives discussed: alternative treatment and delayed treatment  Discussed with patient the purpose of the treatment/procedure, other ways of treating my condition, including no treatment/ procedure and the risks and benefits of the alternatives. Patient has decided to proceed with treatment/procedure.        Universal Protocol:  Relevant documents: relevant documents present and verified  Test results: test results available and properly labeled  Imaging studies: imaging studies available  Required items: required blood products, implants, devices, and special equipment available  Site marked: the operative site was marked  Patient identity confirmed: Patient identify confirmed verbally with patient.        Time out: Immediately prior to procedure a time out was called to verify the correct patient, procedure, equipment, support staff and site/side marked as required      Procedures Details:   Indications: pain   Prep: chlorhexidine  Patient position: prone  Estimated Blood Loss: minimal  Specimens: none  Number of Joints: 2  Approach: right paramedian  Guidance: fluoroscopy  Contrast: Procedure confirmed with contrast under live fluoroscopy.  Needle size: 25 G  Injection procedure: Incremental injection and Negative aspiration for blood  Amount Injected:   L4-5: 2mL  L5-S1: 2mL  Patient tolerance: Patient tolerated the procedure well with no immediate complications. Pressure was applied, and hemostasis was accomplished.  Outcome: Pain unchanged  Comments: 40mg  of depomedrol and 1ml of normal saline injected at each level  This patient's clinical history, exam, AND imaging support radiculopathy AND there is a significant impact on quality of life and function AND their pain score has been documented in this note AND the pain has been present for at least 4 weeks AND they have failed to improve with noninvasive conservative care.             Estimated blood loss: none or minimal  Specimens: none  Patient tolerated the procedure well with no immediate complications. Pressure was applied, and hemostasis was accomplished.
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
## 2020-09-22 NOTE — Patient Instructions
It was a pleasure seeing you in clinic today at the [facility].    We encourage you to review the following to learn more about your health:   Education attached to this visit summary   [email or web address]    For questions about your plan of care, don't hesitate to send us a MyChart message or call our nursing line at  [phone].    For questions related to scheduling, please call our scheduling line at [phone].     If you need a refill of your medication, please contact your pharmacy directly or use the Refill Request option on MyChart. Please allow 72 hours for these requests to be completed. Please note that certain medications may require regular clinic visits     For any imaging or labs;  if you're active on MyChart, you will receive a notification once your test results have been reviewed & released by your physician. If you are not active on MyChart, you will receive your results at your next clinic visit. We will call you if there are any urgent findings.

## 2020-09-25 ENCOUNTER — Encounter: Admit: 2020-09-25 | Discharge: 2020-09-25 | Payer: MEDICARE

## 2020-09-25 ENCOUNTER — Ambulatory Visit: Admit: 2020-09-25 | Discharge: 2020-09-25 | Payer: MEDICARE

## 2020-09-25 DIAGNOSIS — M353 Polymyalgia rheumatica: Secondary | ICD-10-CM

## 2020-09-25 DIAGNOSIS — M25562 Pain in left knee: Secondary | ICD-10-CM

## 2020-09-25 DIAGNOSIS — E785 Hyperlipidemia, unspecified: Secondary | ICD-10-CM

## 2020-09-25 DIAGNOSIS — I8393 Asymptomatic varicose veins of bilateral lower extremities: Secondary | ICD-10-CM

## 2020-09-25 DIAGNOSIS — I82409 Acute embolism and thrombosis of unspecified deep veins of unspecified lower extremity: Secondary | ICD-10-CM

## 2020-09-25 DIAGNOSIS — I6523 Occlusion and stenosis of bilateral carotid arteries: Secondary | ICD-10-CM

## 2020-09-25 DIAGNOSIS — M1990 Unspecified osteoarthritis, unspecified site: Secondary | ICD-10-CM

## 2020-09-25 DIAGNOSIS — M5030 Other cervical disc degeneration, unspecified cervical region: Secondary | ICD-10-CM

## 2020-09-25 DIAGNOSIS — N400 Enlarged prostate without lower urinary tract symptoms: Secondary | ICD-10-CM

## 2020-09-25 DIAGNOSIS — S060X9A Concussion with loss of consciousness of unspecified duration, initial encounter: Secondary | ICD-10-CM

## 2020-09-25 DIAGNOSIS — I714 Abdominal aortic aneurysm, without rupture: Secondary | ICD-10-CM

## 2020-09-25 MED ORDER — HYLAN G-F 20 16 MG/2 ML IX SYRG
16 mg | Freq: Once | INTRA_ARTICULAR | 0 refills | Status: AC
Start: 2020-09-25 — End: ?

## 2020-09-25 MED ORDER — HYLAN G-F 20 16 MG/2 ML IX SYRG
16 mg | Freq: Once | INTRA_ARTICULAR | 0 refills | Status: CP | PRN
Start: 2020-09-25 — End: ?
  Administered 2020-09-25: 15:00:00 16 mg via INTRA_ARTICULAR

## 2020-09-25 NOTE — Procedures
Attending Surgeon: Bella Kennedy, MD    Anesthesia: Local    Pre-Procedure Diagnosis:   1. Left knee pain, unspecified chronicity        Post-Procedure Diagnosis:   1. Left knee pain, unspecified chronicity        Pain Score: Eight    [place] AMB SPINE LARGE JOINT DRAIN/INJECT: L knee on 09/25/2020 10:15 AM    Consent:   Consent obtained: verbal and written  Consent given by: patient  Risks discussed: bleeding, damage to surrounding structures and pain  Alternatives discussed: alternative treatment  Discussed with patient the purpose of the treatment/procedure, other ways of treating my condition, including no treatment/ procedure and the risks and benefits of the alternatives. Patient has decided to proceed with treatment/procedure.        Universal Protocol:  Relevant documents: relevant documents present and verified  Test results: test results available and properly labeled  Imaging studies: imaging studies available  Required items: required blood products, implants, devices, and special equipment available  Site marked: the operative site was marked  Patient identity confirmed: Patient identify confirmed verbally with patient.        Time out: Immediately prior to procedure a "time out" was called to verify the correct patient, procedure, equipment, support staff and site/side marked as required      Procedures Details:  Procedure Peformed: Injection Only  Indications: pain  Details:Prep: povidone-iodine and 2% chlorhexidine   25 G needle, anteromedial approach (guidance: Anatomical )Medications: 16 mg sodium hyaluronate (SYNVISC) 16 mg/2 mL  Outcome: tolerated well, no immediate complications and procedure terminated at patient's request        Estimated blood loss: none or minimal  Specimens: none  Patient tolerated the procedure well with no immediate complications. Pressure was applied, and hemostasis was accomplished.

## 2020-10-09 ENCOUNTER — Encounter: Admit: 2020-10-09 | Discharge: 2020-10-09 | Payer: MEDICARE

## 2020-10-09 ENCOUNTER — Ambulatory Visit: Admit: 2020-10-09 | Discharge: 2020-10-09 | Payer: MEDICARE

## 2020-10-09 DIAGNOSIS — M353 Polymyalgia rheumatica: Secondary | ICD-10-CM

## 2020-10-09 DIAGNOSIS — N400 Enlarged prostate without lower urinary tract symptoms: Secondary | ICD-10-CM

## 2020-10-09 DIAGNOSIS — M1712 Unilateral primary osteoarthritis, left knee: Secondary | ICD-10-CM

## 2020-10-09 DIAGNOSIS — I8393 Asymptomatic varicose veins of bilateral lower extremities: Secondary | ICD-10-CM

## 2020-10-09 DIAGNOSIS — I82409 Acute embolism and thrombosis of unspecified deep veins of unspecified lower extremity: Secondary | ICD-10-CM

## 2020-10-09 DIAGNOSIS — M5030 Other cervical disc degeneration, unspecified cervical region: Secondary | ICD-10-CM

## 2020-10-09 DIAGNOSIS — E785 Hyperlipidemia, unspecified: Secondary | ICD-10-CM

## 2020-10-09 DIAGNOSIS — M1990 Unspecified osteoarthritis, unspecified site: Secondary | ICD-10-CM

## 2020-10-09 DIAGNOSIS — S060X9A Concussion with loss of consciousness of unspecified duration, initial encounter: Secondary | ICD-10-CM

## 2020-10-09 DIAGNOSIS — I6523 Occlusion and stenosis of bilateral carotid arteries: Secondary | ICD-10-CM

## 2020-10-09 DIAGNOSIS — I714 Abdominal aortic aneurysm, without rupture: Secondary | ICD-10-CM

## 2020-10-09 MED ORDER — HYLAN G-F 20 16 MG/2 ML IX SYRG
16 mg | Freq: Once | INTRA_ARTICULAR | 0 refills | Status: CP | PRN
Start: 2020-10-09 — End: ?
  Administered 2020-10-09: 16:00:00 16 mg via INTRA_ARTICULAR

## 2020-10-09 MED ORDER — HYLAN G-F 20 16 MG/2 ML IX SYRG
16 mg | Freq: Once | INTRA_ARTICULAR | 0 refills | Status: AC
Start: 2020-10-09 — End: ?

## 2020-10-09 NOTE — Procedures
Attending Surgeon: Bella Kennedy, MD    Anesthesia: Local    Pre-Procedure Diagnosis:   1. Primary osteoarthritis of left knee        Post-Procedure Diagnosis:   1. Primary osteoarthritis of left knee        Pain Score: Six    Large Joint Drain/Inject: L knee on 10/09/2020 11:15 AM    Consent:   Consent obtained: verbal and written  Consent given by: patient  Risks discussed: bleeding, damage to surrounding structures and pain  Alternatives discussed: alternative treatment  Discussed with patient the purpose of the treatment/procedure, other ways of treating my condition, including no treatment/ procedure and the risks and benefits of the alternatives. Patient has decided to proceed with treatment/procedure.        Universal Protocol:  Relevant documents: relevant documents present and verified  Test results: test results available and properly labeled  Imaging studies: imaging studies available  Required items: required blood products, implants, devices, and special equipment available  Site marked: the operative site was marked  Patient identity confirmed: Patient identify confirmed verbally with patient.        Time out: Immediately prior to procedure a "time out" was called to verify the correct patient, procedure, equipment, support staff and site/side marked as required      Procedures Details:  Procedure Peformed: Injection Only  Indications: pain  Details:Prep: 2% chlorhexidine   25 G needle, ultrasound-guided        Medications: 16 mg sodium hyaluronate (SYNVISC) 16 mg/2 mL  Outcome: tolerated well, no immediate complications        Estimated blood loss: none or minimal  Specimens: none  Patient tolerated the procedure well with no immediate complications. Pressure was applied, and hemostasis was accomplished.

## 2020-11-06 NOTE — Patient Instructions
It was a pleasure seeing you in clinic today at the [facility].    We encourage you to review the following to learn more about your health:   Education attached to this visit summary   [email or web address]    For questions about your plan of care, don't hesitate to send us a MyChart message or call our nursing line at  [phone].    For questions related to scheduling, please call our scheduling line at [phone].     If you need a refill of your medication, please contact your pharmacy directly or use the Refill Request option on MyChart. Please allow 72 hours for these requests to be completed. Please note that certain medications may require regular clinic visits     For any imaging or labs;  if you're active on MyChart, you will receive a notification once your test results have been reviewed & released by your physician. If you are not active on MyChart, you will receive your results at your next clinic visit. We will call you if there are any urgent findings.

## 2020-11-09 ENCOUNTER — Ambulatory Visit: Admit: 2020-11-09 | Discharge: 2020-11-09 | Payer: MEDICARE

## 2020-11-09 DIAGNOSIS — M1712 Unilateral primary osteoarthritis, left knee: Secondary | ICD-10-CM

## 2020-11-09 MED ORDER — SODIUM HYALURONATE (VISCOSUP) 10 MG/ML IX SYRG
20 mg | Freq: Once | INTRA_ARTICULAR | 0 refills | Status: CP | PRN
Start: 2020-11-09 — End: ?
  Administered 2020-11-09: 17:00:00 20 mg via INTRA_ARTICULAR

## 2020-11-09 MED ORDER — HYLAN G-F 20 16 MG/2 ML IX SYRG
16 mg | Freq: Once | INTRA_ARTICULAR | 0 refills | Status: AC
Start: 2020-11-09 — End: ?

## 2020-11-09 NOTE — Procedures
Attending Surgeon: Bella Kennedy, MD    Anesthesia: Local    Pre-Procedure Diagnosis:   1. Primary osteoarthritis of left knee        Post-Procedure Diagnosis:   1. Primary osteoarthritis of left knee        Pain Score: Five    Large Joint Drain/Inject: L knee on 11/09/2020 11:30 AM    Consent:   Consent obtained: verbal and written  Consent given by: patient  Risks discussed: bleeding, damage to surrounding structures and pain  Alternatives discussed: alternative treatment  Discussed with patient the purpose of the treatment/procedure, other ways of treating my condition, including no treatment/ procedure and the risks and benefits of the alternatives. Patient has decided to proceed with treatment/procedure.        Universal Protocol:  Relevant documents: relevant documents present and verified  Test results: test results available and properly labeled  Imaging studies: imaging studies available  Required items: required blood products, implants, devices, and special equipment available  Site marked: the operative site was marked  Patient identity confirmed: Patient identify confirmed verbally with patient.        Time out: Immediately prior to procedure a "time out" was called to verify the correct patient, procedure, equipment, support staff and site/side marked as required      Procedures Details:  Procedure Peformed: Injection Only  Indications: pain  Details:Prep: povidone-iodine   25 G needle, anteromedial approach (guidance: Anatomical )Medications: 20 mg sodium hyaluronate (HYALGAN) 20 mg/73mL  Outcome: tolerated well, no immediate complications          Estimated blood loss: none or minimal  Specimens: none  Patient tolerated the procedure well with no immediate complications. Pressure was applied, and hemostasis was accomplished.

## 2020-12-20 ENCOUNTER — Encounter: Admit: 2020-12-20 | Discharge: 2020-12-20 | Payer: MEDICARE

## 2020-12-20 ENCOUNTER — Ambulatory Visit: Admit: 2020-12-20 | Discharge: 2020-12-20 | Payer: MEDICARE

## 2020-12-20 DIAGNOSIS — E785 Hyperlipidemia, unspecified: Secondary | ICD-10-CM

## 2020-12-20 DIAGNOSIS — S060X9A Concussion with loss of consciousness of unspecified duration, initial encounter: Secondary | ICD-10-CM

## 2020-12-20 DIAGNOSIS — M5030 Other cervical disc degeneration, unspecified cervical region: Secondary | ICD-10-CM

## 2020-12-20 DIAGNOSIS — M545 Chronic right-sided low back pain without sciatica: Secondary | ICD-10-CM

## 2020-12-20 DIAGNOSIS — M353 Polymyalgia rheumatica: Secondary | ICD-10-CM

## 2020-12-20 DIAGNOSIS — I6523 Occlusion and stenosis of bilateral carotid arteries: Secondary | ICD-10-CM

## 2020-12-20 DIAGNOSIS — N400 Enlarged prostate without lower urinary tract symptoms: Secondary | ICD-10-CM

## 2020-12-20 DIAGNOSIS — I82409 Acute embolism and thrombosis of unspecified deep veins of unspecified lower extremity: Secondary | ICD-10-CM

## 2020-12-20 DIAGNOSIS — I714 Abdominal aortic aneurysm, without rupture: Secondary | ICD-10-CM

## 2020-12-20 DIAGNOSIS — I8393 Asymptomatic varicose veins of bilateral lower extremities: Secondary | ICD-10-CM

## 2020-12-20 DIAGNOSIS — M1712 Unilateral primary osteoarthritis, left knee: Secondary | ICD-10-CM

## 2020-12-20 DIAGNOSIS — M1990 Unspecified osteoarthritis, unspecified site: Secondary | ICD-10-CM

## 2021-01-30 ENCOUNTER — Encounter: Admit: 2021-01-30 | Discharge: 2021-01-30 | Payer: MEDICARE

## 2021-01-30 ENCOUNTER — Ambulatory Visit: Admit: 2021-01-30 | Discharge: 2021-01-30 | Payer: MEDICARE

## 2021-01-30 DIAGNOSIS — M5030 Other cervical disc degeneration, unspecified cervical region: Secondary | ICD-10-CM

## 2021-01-30 DIAGNOSIS — S060X9A Concussion with loss of consciousness of unspecified duration, initial encounter: Secondary | ICD-10-CM

## 2021-01-30 DIAGNOSIS — E785 Hyperlipidemia, unspecified: Secondary | ICD-10-CM

## 2021-01-30 DIAGNOSIS — M461 Sacroiliitis, not elsewhere classified: Secondary | ICD-10-CM

## 2021-01-30 DIAGNOSIS — I6523 Occlusion and stenosis of bilateral carotid arteries: Secondary | ICD-10-CM

## 2021-01-30 DIAGNOSIS — N400 Enlarged prostate without lower urinary tract symptoms: Secondary | ICD-10-CM

## 2021-01-30 DIAGNOSIS — I714 Abdominal aortic aneurysm, without rupture: Secondary | ICD-10-CM

## 2021-01-30 DIAGNOSIS — M1990 Unspecified osteoarthritis, unspecified site: Secondary | ICD-10-CM

## 2021-01-30 DIAGNOSIS — I8393 Asymptomatic varicose veins of bilateral lower extremities: Secondary | ICD-10-CM

## 2021-01-30 DIAGNOSIS — I82409 Acute embolism and thrombosis of unspecified deep veins of unspecified lower extremity: Secondary | ICD-10-CM

## 2021-01-30 DIAGNOSIS — M353 Polymyalgia rheumatica: Secondary | ICD-10-CM

## 2021-01-30 NOTE — Progress Notes
SPINE CENTER CLINIC NOTE       SUBJECTIVE:  1 month follow-up visit for right low back pain extending to inferior buttock  Went to PT 2 days a week for 4 weeks  Tolerated PT well and felt pretty good afterwards until he bent over  Pain is right low back and extends to inferior buttock  Pain more lateral low back than midline  Denies leg pain numbness or weakness  Pain is constant  Aggravated by walking standing and bending over.  Bending is the greatest aggravator  Pain will improve some after sitting to rest for couple minutes  Pain ranges VAS 4-8/10         Review of Systems    Current Outpatient Medications:   ?  acetaminophen (TYLENOL) 325 mg tablet, Take two tablets by mouth every 4 hours as needed., Disp: , Rfl:   ?  aspirin EC 81 mg tablet, Take one tablet by mouth daily. Take with food., Disp: 90 tablet, Rfl:   ?  atorvastatin (LIPITOR) 40 mg tablet, Take one tablet by mouth at bedtime daily., Disp: 30 tablet, Rfl: 1  ?  senna/docusate (SENOKOT-S) 8.6/50 mg tablet, Take two tablets by mouth twice daily. Indications: constipation, Disp: , Rfl:   ?  vit C/E/Zn/coppr/lutein/zeaxan (PRESERVISION AREDS-2 PO), Take 1 tablet by mouth twice daily., Disp: , Rfl:   No Known Allergies  Physical Exam  Vitals:    01/30/21 1240   BP: 135/71   BP Source: Arm, Left Upper   Pulse: 69   Temp: 36.5 ?C (97.7 ?F)   Resp: 15   SpO2: 97%   PainSc: Six   Weight: 77.1 kg (170 lb)   Height: 170.2 cm (5' 7)        Pain Score: Six  Body mass index is 26.63 kg/m?Marland Kitchen    General: Alert, cooperative, no distress  Head: Normocephalic, without obvious abnormality, atraumatic  Eyes: Conjunctivae/corneas clear  Lungs: Respirations regular and unlabored  Heart: Normal rate  Abdomen: Non-distended  Extremities: Normal, atraumatic  Skin: No obvious rashes or lesions  Neurologic: Grossly normal. Normal steady gait without use of assistive devices. Normal equal sensation of BLE. 5/5 strength bilaterally to hip flexion, knee flexion/extension, ankle DF/PF.   Psychiatric: Mood and affect normal  Back/Spine: No obvious deformity. (-) Lumbosacral-spine TTP.    + R FABER w/Limited ROM   (-) Distraction test  + R. Thigh thrust   (-) SI Compression Test   + R. Gaenslen's Test           IMPRESSION:  No diagnosis found.      PLAN:    Great relief from Knee visco supplement injections. Knee pain now minimal.   ?  R.  Lateral LOW BACK PAIN w/some extension inferior to buttock   BLLE exam WNL   Scoli xry - 09/12/20  Completed formal PT. continue HET.  Continue to walking in pool.   Schedule diagnostic right SI joint injection with local only  Follow-up in clinic after diagnostic injection  If injection effective consider repeating with steroid.  If ineffective consider right sided lumbar MBBs/RFA   Plan discussed with patient and his daughter whom agree with plan

## 2021-02-07 ENCOUNTER — Encounter: Admit: 2021-02-07 | Discharge: 2021-02-07 | Payer: MEDICARE

## 2021-02-07 ENCOUNTER — Ambulatory Visit: Admit: 2021-02-07 | Discharge: 2021-02-07 | Payer: MEDICARE

## 2021-02-07 DIAGNOSIS — M461 Sacroiliitis, not elsewhere classified: Secondary | ICD-10-CM

## 2021-02-07 DIAGNOSIS — I6523 Occlusion and stenosis of bilateral carotid arteries: Secondary | ICD-10-CM

## 2021-02-07 DIAGNOSIS — I714 Abdominal aortic aneurysm, without rupture: Secondary | ICD-10-CM

## 2021-02-07 DIAGNOSIS — I82409 Acute embolism and thrombosis of unspecified deep veins of unspecified lower extremity: Secondary | ICD-10-CM

## 2021-02-07 DIAGNOSIS — N400 Enlarged prostate without lower urinary tract symptoms: Secondary | ICD-10-CM

## 2021-02-07 DIAGNOSIS — S060X9A Concussion with loss of consciousness of unspecified duration, initial encounter: Secondary | ICD-10-CM

## 2021-02-07 DIAGNOSIS — E785 Hyperlipidemia, unspecified: Secondary | ICD-10-CM

## 2021-02-07 DIAGNOSIS — I8393 Asymptomatic varicose veins of bilateral lower extremities: Secondary | ICD-10-CM

## 2021-02-07 DIAGNOSIS — M353 Polymyalgia rheumatica: Secondary | ICD-10-CM

## 2021-02-07 DIAGNOSIS — M1990 Unspecified osteoarthritis, unspecified site: Secondary | ICD-10-CM

## 2021-02-07 DIAGNOSIS — M5030 Other cervical disc degeneration, unspecified cervical region: Secondary | ICD-10-CM

## 2021-02-07 MED ORDER — DEXAMETHASONE SODIUM PHOS (PF) 10 MG/ML IJ EPIDURAL SOLN
8 mg | Freq: Once | EPIDURAL | 0 refills | Status: CP
Start: 2021-02-07 — End: ?

## 2021-02-07 MED ORDER — IOHEXOL 300 MG IODINE/ML IV SOLN
2 mL | Freq: Once | 0 refills | Status: CP
Start: 2021-02-07 — End: ?

## 2021-02-07 MED ORDER — LIDOCAINE (PF) 10 MG/ML (1 %) IJ SOLN
2 mL | Freq: Once | INTRAMUSCULAR | 0 refills | Status: AC
Start: 2021-02-07 — End: ?

## 2021-02-07 MED ORDER — METHYLPREDNISOLONE ACETATE 40 MG/ML IJ SUSP
40 mg | Freq: Once | INTRAMUSCULAR | 0 refills | Status: AC
Start: 2021-02-07 — End: ?

## 2021-02-07 MED ORDER — BUPIVACAINE (PF) 0.5 % (5 MG/ML) IJ SOLN
3 mL | Freq: Once | INTRAMUSCULAR | 0 refills | Status: CP
Start: 2021-02-07 — End: ?

## 2021-02-07 NOTE — Procedures
Attending Surgeon: Bella Kennedy, MD    Anesthesia: Local    Pre-Procedure Diagnosis:   1. Sacroiliitis (HCC)        Post-Procedure Diagnosis:   1. Sacroiliitis (HCC)             SI Joint Inject Anesth/Steroid (Procedure)  Location: sacroiliac joint R sacroiliac joint9/12/2020 12:53 PM    Consent:   Consent obtained: verbal and written  Consent given by: patient  Risks discussed: bleeding, damage to surrounding structures, infection, nerve damage and pain  Alternatives discussed: alternative treatment, delayed treatment and no treatment  Discussed with patient the purpose of the treatment/procedure, other ways of treating my condition, including no treatment/ procedure and the risks and benefits of the alternatives. Patient has decided to proceed with treatment/procedure.        Universal Protocol:  Relevant documents: relevant documents present and verified  Test results: test results available and properly labeled  Imaging studies: imaging studies available  Required items: required blood products, implants, devices, and special equipment available  Site marked: the operative site was marked  Patient identity confirmed: Patient identify confirmed verbally with patient.        Time out: Immediately prior to procedure a time out was called to verify the correct patient, procedure, equipment, support staff and site/side marked as required      Procedures Details:   Indications: pain   Prep: 2% chlorhexidine  Guidance: fluoroscopy  Needle size: 25 G  Approach: posterior  Patient tolerance: Patient tolerated the procedure well with no immediate complications. Pressure was applied, and hemostasis was accomplished.  Comments: 10mg  of decadron and 4ml of 0.5%bupivicaine injected into the joint           Administrations This Visit     bupivacaine PF (MARCAINE) 0.5 % injection 3 mL     Admin Date  02/07/2021 Action  Given Dose  3 mL Route  Injection Administered By  Leta Baptist, RN          dexamethasone PF (DECARDON) epidural injection 8 mg     Admin Date  02/07/2021 Action  Given Dose  8 mg Route  Epidural Administered By  Bella Kennedy, MD          iohexol (OMNIPAQUE-300) 300 mg/mL injection 2 mL     Admin Date  02/07/2021 Action  Given Dose  2 mL Route  SEE ADMIN INSTRUCTIONS Administered By  Leta Baptist, RN              Estimated blood loss: none or minimal  Specimens: none  Patient tolerated the procedure well with no immediate complications. Pressure was applied, and hemostasis was accomplished.

## 2021-02-07 NOTE — Discharge Instructions - Supplementary Instructions
GENERAL POST PROCEDURE INSTRUCTIONS  Physician: _________________________________  Procedure Completed Today:  Joint Injection (hip, knee, shoulder)  Cervical Epidural Steroid Injection  Cervical Transforaminal Steroid Injection  Trigger Point Injection  Caudal Epidural Steroid Injection  Pudendal Nerve Block  Other _____________________ Thoracic Epidural Steroid Injection  Lumbar Epidural Steroid Injection  Lumbar Transforaminal Steroid Injection  Facet Joint Injection  Celiac Nerve Block  Sacrococcygeal  Sacroiliac Joint Injection   Important information following your procedure today:  You may drive today     If you had sedation, you may NOT drive today  Rest at home for the next 6 hours.  You may then begin to resume your normal activities.  DO NOT drive any vehicle, operate any power tools, drink alcohol, make any major decisions, or sign any legal documents for the next 12 hours.  Pain relief may not be immediate. It is possible you may even experience an increase in pain during the first 24-48 hours followed by a gradual decrease of your pain.  Though the procedure is generally safe, and complications are rare, we do ask that you be aware of any of the following:  Any swelling, persistent redness, new bleeding or drainage from the site of the injection.  You should not experience a severe headache.  You should not run a fever over 101oF.  New onset of sharp, severe back and or neck pain.  New onset of upper or lower extremity numbness or weakness.  New difficulty controlling bowel or bladder function after injection.  New shortness of breath.  ** If any of these occur, please call to report this occurrence to a nurse at [phone]. If you are calling after 4:00 p.m. or on weekends or holidays, please call [phone] and ask to have the resident physician on call for the physician paged or go to your local emergency room.  You may experience soreness at the injection site. Ice can be applied at 20-minute intervals for the first 24 hours. The following day you may alternate ice with heat if you are experiencing muscle tightness, otherwise continue with ice. Ice works best at decreasing pain. Avoid application of direct heat, hot showers or hot tubs today.  Avoid strenuous activity today. You many resume your regular activities and exercise tomorrow.  Patients with diabetes may see an elevation in blood sugars for 7-10 days after the injection. It is important to pay close attention to your diet, check your blood sugars daily and report extreme elevations to the physician that manages your diabetes.  Patients taking daily blood thinners can resume their regular dose this evening.  It is important that you take all medications ordered by your pain physician. Taking medications as ordered is an important part of your pain care plan. If you cannot continue the medication plan, please notify the physician.    Possible side effects to steroids that may occur:  Flushing or redness of the face  Irritability  Fluid retention  Change in women's menses  Minor headache    If you are unable to keep your upcoming appointment, please notify the [facility] scheduler at [phone] at least 24 hours in advance. If you have questions for the surgery center, call [facility] at [phone].
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
## 2021-02-09 ENCOUNTER — Encounter: Admit: 2021-02-09 | Discharge: 2021-02-09 | Payer: MEDICARE

## 2021-02-09 ENCOUNTER — Ambulatory Visit: Admit: 2021-02-09 | Discharge: 2021-02-09 | Payer: MEDICARE

## 2021-02-09 DIAGNOSIS — M353 Polymyalgia rheumatica: Secondary | ICD-10-CM

## 2021-02-09 DIAGNOSIS — E785 Hyperlipidemia, unspecified: Secondary | ICD-10-CM

## 2021-02-09 DIAGNOSIS — I6523 Occlusion and stenosis of bilateral carotid arteries: Secondary | ICD-10-CM

## 2021-02-09 DIAGNOSIS — I714 Abdominal aortic aneurysm, without rupture: Secondary | ICD-10-CM

## 2021-02-09 DIAGNOSIS — S060X9A Concussion with loss of consciousness of unspecified duration, initial encounter: Secondary | ICD-10-CM

## 2021-02-09 DIAGNOSIS — M5030 Other cervical disc degeneration, unspecified cervical region: Secondary | ICD-10-CM

## 2021-02-09 DIAGNOSIS — M461 Sacroiliitis, not elsewhere classified: Secondary | ICD-10-CM

## 2021-02-09 DIAGNOSIS — I82409 Acute embolism and thrombosis of unspecified deep veins of unspecified lower extremity: Secondary | ICD-10-CM

## 2021-02-09 DIAGNOSIS — M1712 Unilateral primary osteoarthritis, left knee: Secondary | ICD-10-CM

## 2021-02-09 DIAGNOSIS — N400 Enlarged prostate without lower urinary tract symptoms: Secondary | ICD-10-CM

## 2021-02-09 DIAGNOSIS — I8393 Asymptomatic varicose veins of bilateral lower extremities: Secondary | ICD-10-CM

## 2021-02-09 DIAGNOSIS — M1990 Unspecified osteoarthritis, unspecified site: Secondary | ICD-10-CM

## 2021-02-09 NOTE — Progress Notes
SPINE CENTER CLINIC NOTE       SUBJECTIVE:   2 day FUV s/p Right SI joint inj (therapeutic - with steroid)   Difficult to rate relief but feeling really good  Little aggravation of pain after prolonged bending to work on car yesterday  This improved though  Sustained relief from knee injection  Knee does not really bother him much at this time unless walking on uneven surface  Uses golf cart in his big yard and that helps  Overall doing very well at this time       Review of Systems    Current Outpatient Medications:   ?  acetaminophen (TYLENOL) 325 mg tablet, Take two tablets by mouth every 4 hours as needed., Disp: , Rfl:   ?  aspirin EC 81 mg tablet, Take one tablet by mouth daily. Take with food., Disp: 90 tablet, Rfl:   ?  atorvastatin (LIPITOR) 40 mg tablet, Take one tablet by mouth at bedtime daily., Disp: 30 tablet, Rfl: 1  ?  senna/docusate (SENOKOT-S) 8.6/50 mg tablet, Take two tablets by mouth twice daily. Indications: constipation, Disp: , Rfl:   ?  vit C/E/Zn/coppr/lutein/zeaxan (PRESERVISION AREDS-2 PO), Take 1 tablet by mouth twice daily., Disp: , Rfl:   No Known Allergies  Physical Exam  Vitals:    02/09/21 1055   BP: (!) 147/73   Pulse: 65   SpO2: 98%   PainSc: Four   Weight: 77.1 kg (170 lb)   Height: 170.2 cm (5' 7)        Pain Score: Four  Body mass index is 26.63 kg/m?Marland Kitchen    General: Alert, cooperative, no distress  Head: Normocephalic, without obvious abnormality, atraumatic  Eyes: Conjunctivae/corneas clear  Lungs: Respirations regular and unlabored  Heart: Normal rate  Abdomen: Non-distended  Extremities: Normal, atraumatic  Skin: No obvious rashes or lesions  Neurologic: Grossly normal. Normal slow steady gait without use of assistive devices.   Psychiatric: Mood and affect normal  Back/Spine: No obvious deformity.            IMPRESSION:  1. Sacroiliitis (HCC)    2. Primary osteoarthritis of left knee          PLAN:    Great relief from Knee visco supplement injections. Knee pain now minimal and manageable with activity modifications.  ?  R.  Lateral LOW BACK PAIN w/some extension inferior to buttock   Scoli xry - 09/12/20  Completed formal PT. continue HET.  Right SI joint injection with steroid 02/07/2021-patient unable to rate percentage of relief but expresses he is feeling much better only 2 days after injection  Encouraged activity modification limiting excessive/repetitive bending and low back to prevent aggravation of this pain    Follow-up in 3 months or sooner if needed

## 2021-05-09 ENCOUNTER — Encounter: Admit: 2021-05-09 | Discharge: 2021-05-09 | Payer: MEDICARE

## 2021-05-09 ENCOUNTER — Ambulatory Visit: Admit: 2021-05-09 | Discharge: 2021-05-09 | Payer: MEDICARE

## 2021-05-09 DIAGNOSIS — M5030 Other cervical disc degeneration, unspecified cervical region: Secondary | ICD-10-CM

## 2021-05-09 DIAGNOSIS — I6523 Occlusion and stenosis of bilateral carotid arteries: Secondary | ICD-10-CM

## 2021-05-09 DIAGNOSIS — I8393 Asymptomatic varicose veins of bilateral lower extremities: Secondary | ICD-10-CM

## 2021-05-09 DIAGNOSIS — M353 Polymyalgia rheumatica: Secondary | ICD-10-CM

## 2021-05-09 DIAGNOSIS — M461 Sacroiliitis, not elsewhere classified: Secondary | ICD-10-CM

## 2021-05-09 DIAGNOSIS — M1990 Unspecified osteoarthritis, unspecified site: Secondary | ICD-10-CM

## 2021-05-09 DIAGNOSIS — I82409 Acute embolism and thrombosis of unspecified deep veins of unspecified lower extremity: Secondary | ICD-10-CM

## 2021-05-09 DIAGNOSIS — I714 AAA (abdominal aortic aneurysm): Secondary | ICD-10-CM

## 2021-05-09 DIAGNOSIS — S060XAA Concussion: Secondary | ICD-10-CM

## 2021-05-09 DIAGNOSIS — E785 Hyperlipidemia, unspecified: Secondary | ICD-10-CM

## 2021-05-09 DIAGNOSIS — N400 Enlarged prostate without lower urinary tract symptoms: Secondary | ICD-10-CM

## 2021-05-09 MED ORDER — IOHEXOL 240 MG IODINE/ML IV SOLN
1 mL | Freq: Once | EPIDURAL | 0 refills | Status: CP
Start: 2021-05-09 — End: ?

## 2021-05-09 MED ORDER — LIDOCAINE (PF) 10 MG/ML (1 %) IJ SOLN
2 mL | Freq: Once | INTRAMUSCULAR | 0 refills | Status: CP
Start: 2021-05-09 — End: ?

## 2021-05-09 MED ORDER — BUPIVACAINE (PF) 0.5 % (5 MG/ML) IJ SOLN
5 mL | Freq: Once | INTRAMUSCULAR | 0 refills | Status: CP
Start: 2021-05-09 — End: ?

## 2021-05-09 MED ORDER — METHYLPREDNISOLONE ACETATE 40 MG/ML IJ SUSP
40 mg | Freq: Once | INTRAMUSCULAR | 0 refills | Status: CP
Start: 2021-05-09 — End: ?

## 2021-05-09 NOTE — Procedures
Attending Surgeon: Bella Kennedy, MD    Anesthesia: Local    Pre-Procedure Diagnosis:   1. Sacroiliitis (HCC)        Post-Procedure Diagnosis:   1. Sacroiliitis (HCC)             SI Joint Inject Anesth/Steroid (Procedure)  Location: sacroiliac joint R sacroiliac joint12/12/2020 11:13 AM    Consent:   Consent obtained: verbal and written  Consent given by: patient  Risks discussed: bleeding, damage to surrounding structures, infection, nerve damage and pain  Alternatives discussed: alternative treatment, delayed treatment and no treatment  Discussed with patient the purpose of the treatment/procedure, other ways of treating my condition, including no treatment/ procedure and the risks and benefits of the alternatives. Patient has decided to proceed with treatment/procedure.        Universal Protocol:  Relevant documents: relevant documents present and verified  Test results: test results available and properly labeled  Imaging studies: imaging studies available  Required items: required blood products, implants, devices, and special equipment available  Site marked: the operative site was marked  Patient identity confirmed: Patient identify confirmed verbally with patient.        Time out: Immediately prior to procedure a time out was called to verify the correct patient, procedure, equipment, support staff and site/side marked as required      Procedures Details:   Indications: pain   Prep: 2% chlorhexidine  Guidance: fluoroscopy  Needle size: 25 G  Approach: posterior  Patient tolerance: Patient tolerated the procedure well with no immediate complications. Pressure was applied, and hemostasis was accomplished.  Comments: 40mg  of depomedrol and 4ml of 0.5%bupivicaine injected into the joint           Administrations This Visit     bupivacaine PF (MARCAINE) 0.5 % injection 5 mL     Admin Date  05/09/2021 Action  Given Dose  5 mL Route  Injection Administered By  Bella Kennedy, MD          iohexoL (OMNIPAQUE-240) 240 mg/mL injection 1 mL     Admin Date  05/09/2021 Action  Given Dose  1 mL Route  Epidural Administered By  Juleen Starr, RN          lidocaine PF 1% (10 mg/mL) injection 2 mL     Admin Date  05/09/2021 Action  Given Dose  2 mL Route  Injection Administered By  Juleen Starr, RN          methylPREDNISolone ACETATE (DEPO-Medrol) injection 40 mg     Admin Date  05/09/2021 Action  Given Dose  40 mg Route  Injection Administered By  Juleen Starr, RN              Estimated blood loss: none or minimal  Specimens: none  Patient tolerated the procedure well with no immediate complications. Pressure was applied, and hemostasis was accomplished.

## 2021-05-09 NOTE — Discharge Instructions - Supplementary Instructions
GENERAL POST PROCEDURE INSTRUCTIONS  Physician: _________________________________  Procedure Completed Today:  Joint Injection (hip, knee, shoulder)  Cervical Epidural Steroid Injection  Cervical Transforaminal Steroid Injection  Trigger Point Injection  Caudal Epidural Steroid Injection  Pudendal Nerve Block  Other _____________________ Thoracic Epidural Steroid Injection  Lumbar Epidural Steroid Injection  Lumbar Transforaminal Steroid Injection  Facet Joint Injection  Celiac Nerve Block  Sacrococcygeal  Sacroiliac Joint Injection   Important information following your procedure today:  You may drive today     If you had sedation, you may NOT drive today  Rest at home for the next 6 hours.  You may then begin to resume your normal activities.  DO NOT drive any vehicle, operate any power tools, drink alcohol, make any major decisions, or sign any legal documents for the next 12 hours.  Pain relief may not be immediate. It is possible you may even experience an increase in pain during the first 24-48 hours followed by a gradual decrease of your pain.  Though the procedure is generally safe, and complications are rare, we do ask that you be aware of any of the following:  Any swelling, persistent redness, new bleeding or drainage from the site of the injection.  You should not experience a severe headache.  You should not run a fever over 101oF.  New onset of sharp, severe back and or neck pain.  New onset of upper or lower extremity numbness or weakness.  New difficulty controlling bowel or bladder function after injection.  New shortness of breath.  ** If any of these occur, please call to report this occurrence to a nurse at [phone]. If you are calling after 4:00 p.m. or on weekends or holidays, please call [phone] and ask to have the resident physician on call for the physician paged or go to your local emergency room.  You may experience soreness at the injection site. Ice can be applied at 20-minute intervals for the first 24 hours. The following day you may alternate ice with heat if you are experiencing muscle tightness, otherwise continue with ice. Ice works best at decreasing pain. Avoid application of direct heat, hot showers or hot tubs today.  Avoid strenuous activity today. You many resume your regular activities and exercise tomorrow.  Patients with diabetes may see an elevation in blood sugars for 7-10 days after the injection. It is important to pay close attention to your diet, check your blood sugars daily and report extreme elevations to the physician that manages your diabetes.  Patients taking daily blood thinners can resume their regular dose this evening.  It is important that you take all medications ordered by your pain physician. Taking medications as ordered is an important part of your pain care plan. If you cannot continue the medication plan, please notify the physician.    Possible side effects to steroids that may occur:  Flushing or redness of the face  Irritability  Fluid retention  Change in women's menses  Minor headache    If you are unable to keep your upcoming appointment, please notify the [facility] scheduler at [phone] at least 24 hours in advance. If you have questions for the surgery center, call [facility] at [phone].
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
## 2021-07-09 ENCOUNTER — Encounter: Admit: 2021-07-09 | Discharge: 2021-07-09 | Payer: MEDICARE

## 2021-07-09 ENCOUNTER — Ambulatory Visit: Admit: 2021-07-09 | Discharge: 2021-07-09 | Payer: MEDICARE

## 2021-07-09 DIAGNOSIS — R609 Edema, unspecified: Secondary | ICD-10-CM

## 2021-12-18 ENCOUNTER — Encounter: Admit: 2021-12-18 | Discharge: 2021-12-18 | Payer: MEDICARE

## 2021-12-19 ENCOUNTER — Encounter: Admit: 2021-12-19 | Discharge: 2021-12-19 | Payer: MEDICARE

## 2021-12-19 DIAGNOSIS — I82409 Acute embolism and thrombosis of unspecified deep veins of unspecified lower extremity: Secondary | ICD-10-CM

## 2021-12-19 DIAGNOSIS — M353 Polymyalgia rheumatica: Secondary | ICD-10-CM

## 2021-12-19 DIAGNOSIS — N400 Enlarged prostate without lower urinary tract symptoms: Secondary | ICD-10-CM

## 2021-12-19 DIAGNOSIS — E785 Hyperlipidemia, unspecified: Secondary | ICD-10-CM

## 2021-12-19 DIAGNOSIS — I6523 Occlusion and stenosis of bilateral carotid arteries: Secondary | ICD-10-CM

## 2021-12-19 DIAGNOSIS — I8393 Asymptomatic varicose veins of bilateral lower extremities: Secondary | ICD-10-CM

## 2021-12-19 DIAGNOSIS — M1990 Unspecified osteoarthritis, unspecified site: Secondary | ICD-10-CM

## 2021-12-19 DIAGNOSIS — S060XAA Concussion: Secondary | ICD-10-CM

## 2021-12-19 DIAGNOSIS — M5030 Other cervical disc degeneration, unspecified cervical region: Secondary | ICD-10-CM

## 2021-12-19 DIAGNOSIS — I714 AAA (abdominal aortic aneurysm) (HCC): Secondary | ICD-10-CM

## 2021-12-19 NOTE — Telephone Encounter
12/19/21 - Records have been requested from PCP listed in patient's chart - Dr. Lona Kettle   (F) [phone] (P) [phone] / sjg

## 2021-12-25 ENCOUNTER — Encounter: Admit: 2021-12-25 | Discharge: 2021-12-25 | Payer: MEDICARE

## 2021-12-25 DIAGNOSIS — S060XAA Concussion: Secondary | ICD-10-CM

## 2021-12-25 DIAGNOSIS — I714 Abdominal aortic aneurysm (AAA) without rupture, unspecified part (HCC): Secondary | ICD-10-CM

## 2021-12-25 DIAGNOSIS — M353 Polymyalgia rheumatica: Secondary | ICD-10-CM

## 2021-12-25 DIAGNOSIS — M1990 Unspecified osteoarthritis, unspecified site: Secondary | ICD-10-CM

## 2021-12-25 DIAGNOSIS — E785 Hyperlipidemia, unspecified: Secondary | ICD-10-CM

## 2021-12-25 DIAGNOSIS — Z01810 Encounter for preprocedural cardiovascular examination: Secondary | ICD-10-CM

## 2021-12-25 DIAGNOSIS — Z01812 Encounter for preprocedural laboratory examination: Secondary | ICD-10-CM

## 2021-12-25 DIAGNOSIS — N400 Enlarged prostate without lower urinary tract symptoms: Secondary | ICD-10-CM

## 2021-12-25 DIAGNOSIS — I8393 Asymptomatic varicose veins of bilateral lower extremities: Secondary | ICD-10-CM

## 2021-12-25 DIAGNOSIS — I82409 Acute embolism and thrombosis of unspecified deep veins of unspecified lower extremity: Secondary | ICD-10-CM

## 2021-12-25 DIAGNOSIS — I6523 Occlusion and stenosis of bilateral carotid arteries: Secondary | ICD-10-CM

## 2021-12-25 DIAGNOSIS — M5030 Other cervical disc degeneration, unspecified cervical region: Secondary | ICD-10-CM

## 2021-12-25 LAB — BASIC METABOLIC PANEL
BLD UREA NITROGEN: 25
CALCIUM: 9.3
CHLORIDE: 106
CO2: 25
CREATININE: 1.1
GLUCOSE,PANEL: 99
POTASSIUM: 4.2
SODIUM: 141

## 2021-12-25 NOTE — Patient Instructions
Thank you for visiting our office today.    Continue the same medications as you have been doing.          We will be pursuing the following tests after your appointment today:    Stress test- [facility] scheduling will contact you to schedule  Labs- you can have these at you lab of preference  Ct scan of abdomen and pelvis- please call the radiology scheduler at [phone]        We will plan to see you back in 1 year.  Please call us in the meantime with any questions or concerns.        Please allow 5-7 business days for our providers to review your results. All normal results will go to MyChart. If you do not have Mychart, it is strongly recommended to get this so you can easily view all your results. If you do not have mychart, we will attempt to call you once with normal lab and testing results. If we cannot reach you by phone with normal results, we will send you a letter.  If you have not heard the results of your testing after one week please give [place] a call.       Your Cardiovascular Medicine [place] Team Brett Canales, Pilar Jarvis and Deloit)  phone number is [phone].

## 2022-01-01 ENCOUNTER — Encounter: Admit: 2022-01-01 | Discharge: 2022-01-01 | Payer: MEDICARE

## 2022-01-01 ENCOUNTER — Ambulatory Visit: Admit: 2022-01-01 | Discharge: 2022-01-01 | Payer: MEDICARE

## 2022-01-01 DIAGNOSIS — Z01810 Encounter for preprocedural cardiovascular examination: Secondary | ICD-10-CM

## 2022-01-01 DIAGNOSIS — I714 Abdominal aortic aneurysm (AAA) without rupture, unspecified part (HCC): Secondary | ICD-10-CM

## 2022-01-02 ENCOUNTER — Ambulatory Visit: Admit: 2022-01-02 | Discharge: 2022-01-02 | Payer: MEDICARE

## 2022-01-02 ENCOUNTER — Encounter: Admit: 2022-01-02 | Discharge: 2022-01-02 | Payer: MEDICARE

## 2022-01-02 DIAGNOSIS — I714 Abdominal aortic aneurysm (AAA) without rupture, unspecified part (HCC): Secondary | ICD-10-CM

## 2022-01-02 DIAGNOSIS — Z01810 Encounter for preprocedural cardiovascular examination: Secondary | ICD-10-CM

## 2022-01-02 MED ORDER — IOHEXOL 350 MG IODINE/ML IV SOLN
100 mL | Freq: Once | INTRAVENOUS | 0 refills | Status: CP
Start: 2022-01-02 — End: ?
  Administered 2022-01-02: 19:00:00 100 mL via INTRAVENOUS

## 2022-01-02 MED ORDER — SODIUM CHLORIDE 0.9 % IJ SOLN
50 mL | Freq: Once | INTRAVENOUS | 0 refills | Status: CP
Start: 2022-01-02 — End: ?
  Administered 2022-01-02: 19:00:00 50 mL via INTRAVENOUS

## 2022-01-02 NOTE — Telephone Encounter
Results and recommendations called to patient's dauhter

## 2022-01-02 NOTE — Telephone Encounter
-----   Message from Altamease Oiler, MD sent at 01/02/2022  4:12 PM CDT -----  Please let him know that his stress test looked great.  Please let me know when the CT of his abdomen and pelvis is completed.  Thanks!

## 2022-01-03 ENCOUNTER — Encounter: Admit: 2022-01-03 | Discharge: 2022-01-03 | Payer: MEDICARE

## 2022-01-09 ENCOUNTER — Encounter: Admit: 2022-01-09 | Discharge: 2022-01-09 | Payer: MEDICARE

## 2022-01-09 DIAGNOSIS — I7143 Infrarenal abdominal aortic aneurysm (AAA) without rupture (HCC): Secondary | ICD-10-CM

## 2022-01-09 NOTE — Telephone Encounter
-----   Message from Carlena Sax, DO sent at 01/09/2022  7:51 AM CDT -----  CT angiogram reviewed.  The endograft is in perfect position.  There is no evidence of endoleak.  The patient is cleared for surgery from my standpoint.  We need to reestablish follow-up for the patient and emphasized to him that this graft needs to be followed forever.  Please schedule him for aortic duplex, ABI, and office visit in 1 year.  ----- Message -----  From: Mamie Laurel, RN  Sent: 01/03/2022   8:50 AM CDT  To: Carlena Sax, DO    Can you review this patient's CTA. He is an [facility] patient performed in December of 2019 who got lost in follow up. Went to see Dr. Sandria Manly for cardiac clearance for a knee surgery and Dr. Sandria Manly ordered the CTA to be done to check his aneurysm. He is scheduled for a visit with you via telehealth on 8/16. He is wanting to be cleared for knee surgery from a vascular standpoint as well.

## 2022-01-09 NOTE — Telephone Encounter
Results/recommendations sent to patient via mychart. New orders placed for 1 year F/U.

## 2022-01-16 ENCOUNTER — Encounter: Admit: 2022-01-16 | Discharge: 2022-01-16 | Payer: MEDICARE

## 2022-02-04 ENCOUNTER — Encounter: Admit: 2022-02-04 | Discharge: 2022-02-04 | Payer: MEDICARE

## 2022-02-04 ENCOUNTER — Inpatient Hospital Stay: Payer: MEDICARE

## 2022-02-04 NOTE — Care Coordination-Inpatient
AOD Admission Note        Reason for Admission: Failure to thrive, edema, diarrhea/abnormal stools, low urine output after foley removal    Current Location: [place], [place] swing bed      Admission Plan:    Needs GI consult (daughter works in GI lab)    Nurse communication order placed to contact [phone] pager when patient arrives.    Ladell Pier, MD        *All above information is per verbal handoff from the referring provider and/or point of contact.  This patient has not been directly evaluated by myself at time of discussion.  Each reported account should be verified by admitting practicioners.  Additional objective information reviewed from prior encounters is outlined in italics.

## 2022-02-04 NOTE — Progress Notes
Labs: CBC: ; Chem profile:     VS:     Imaging:     Medications:     PMH:     Indication for transfer:

## 2022-02-04 NOTE — Progress Notes
Pt. Had a left knee replacement done 8/22, therapy was slow due to pain.  Pt. Put in swing bed.  Pt. Has developed some blisters below the knee.      Pt. Is concerned that leg is swollen today.  Pt. On Eliquis, US done, no DVT.    Pt. Is anemic, iron was low at 27, they tried iron supplements, pt. Vomited.  Hgb. Stable.      Ever since surgery pt. Has had clear stools, jelly stools, no smell.  KUB done; no obstruction.  Pt. Is eating.  Stool samples sent; not back.      VSS    Pt. Is doing therapy now.      Post op pt. Had foley caheter, he had urinary retention after getting the first one pulled out after knee surgery, foley was taken out today at noon, no urine yet.

## 2022-02-05 ENCOUNTER — Inpatient Hospital Stay: Admit: 2022-02-05 | Discharge: 2022-02-05 | Payer: MEDICARE

## 2022-02-05 MED ADMIN — ALLOPURINOL 300 MG PO TAB [311]: 300 mg | ORAL | @ 13:00:00 | NDC 00904657261

## 2022-02-05 MED ADMIN — POTASSIUM CHLORIDE 20 MEQ PO TBTQ [35943]: 60 meq | ORAL | @ 09:00:00 | Stop: 2022-02-05 | NDC 00832532511

## 2022-02-05 MED ADMIN — SODIUM CHLORIDE 0.9 % IV SOLP [27838]: 250 mL | INTRAVENOUS | @ 15:00:00 | Stop: 2022-02-05 | NDC 00338004902

## 2022-02-05 MED ADMIN — POTASSIUM CHLORIDE IN WATER 10 MEQ/50 ML IV PGBK [11075]: 10 meq | INTRAVENOUS | @ 15:00:00 | Stop: 2022-02-05 | NDC 00338070541

## 2022-02-05 MED ADMIN — POTASSIUM CHLORIDE 20 MEQ PO TBTQ [35943]: 40 meq | ORAL | @ 17:00:00 | Stop: 2022-02-05 | NDC 00832532511

## 2022-02-05 MED ADMIN — SODIUM CHLORIDE 0.9 % IV SOLP [27838]: 500 mL | INTRAVENOUS | @ 23:00:00 | Stop: 2022-02-05 | NDC 00338004903

## 2022-02-05 MED ADMIN — TAMSULOSIN 0.4 MG PO CAP [80077]: 0.4 mg | ORAL | @ 13:00:00 | NDC 00904640161

## 2022-02-05 MED ADMIN — ACETAMINOPHEN 500 MG PO TAB [102]: 1000 mg | ORAL | @ 17:00:00 | NDC 00904673061

## 2022-02-05 MED ADMIN — KETOROLAC 15 MG/ML IJ SOLN [22472]: 15 mg | INTRAVENOUS | @ 20:00:00 | Stop: 2022-02-10 | NDC 72611071901

## 2022-02-05 MED ADMIN — OXYCODONE 5 MG PO TAB [10814]: 5 mg | ORAL | @ 13:00:00 | NDC 68084035411

## 2022-02-06 ENCOUNTER — Observation Stay: Admit: 2022-02-06 | Discharge: 2022-02-06 | Payer: MEDICARE

## 2022-02-06 MED ADMIN — SENNOSIDES 8.6 MG PO TAB [11349]: 1 | ORAL | @ 17:00:00 | NDC 00904725261

## 2022-02-06 MED ADMIN — ACETAMINOPHEN 500 MG PO TAB [102]: 1000 mg | ORAL | @ 11:00:00 | NDC 00904673061

## 2022-02-06 MED ADMIN — ACETAMINOPHEN 500 MG PO TAB [102]: 1000 mg | ORAL | @ 02:00:00 | NDC 00904673061

## 2022-02-06 MED ADMIN — APIXABAN 2.5 MG PO TAB [315777]: 2.5 mg | ORAL | @ 02:00:00 | NDC 00003089331

## 2022-02-06 MED ADMIN — KETOROLAC 15 MG/ML IJ SOLN [22472]: 15 mg | INTRAVENOUS | @ 16:00:00 | Stop: 2022-02-10 | NDC 00409379319

## 2022-02-06 MED ADMIN — MELATONIN 5 MG PO TAB [168576]: 5 mg | ORAL | @ 02:00:00 | NDC 77333052025

## 2022-02-06 MED ADMIN — ACETAMINOPHEN 500 MG PO TAB [102]: 1000 mg | ORAL | @ 17:00:00 | NDC 00904673061

## 2022-02-06 MED ADMIN — ALLOPURINOL 300 MG PO TAB [311]: 300 mg | ORAL | @ 14:00:00 | NDC 00904657261

## 2022-02-06 MED ADMIN — POLYETHYLENE GLYCOL 3350 17 GRAM PO PWPK [25424]: 17 g | ORAL | @ 14:00:00 | Stop: 2022-02-06 | NDC 00904693186

## 2022-02-06 MED ADMIN — APIXABAN 2.5 MG PO TAB [315777]: 2.5 mg | ORAL | @ 14:00:00 | NDC 00003089331

## 2022-02-06 MED ADMIN — PEG-ELECTROLYTE SOLN 420 GRAM PO SOLR [79142]: 2 L | ORAL | @ 21:00:00 | Stop: 2022-02-07 | NDC 64380076921

## 2022-02-06 MED ADMIN — PANTOPRAZOLE 40 MG PO TBEC [80436]: 40 mg | ORAL | @ 17:00:00 | NDC 00904647461

## 2022-02-06 MED ADMIN — SERTRALINE 25 MG PO TAB [81319]: 25 mg | ORAL | @ 02:00:00 | NDC 00904692461

## 2022-02-06 MED ADMIN — TAMSULOSIN 0.4 MG PO CAP [80077]: 0.4 mg | ORAL | @ 14:00:00 | NDC 00904640161

## 2022-02-06 MED ADMIN — GABAPENTIN 100 MG PO CAP [18309]: 100 mg | ORAL | @ 02:00:00 | NDC 00904666561

## 2022-02-06 MED ADMIN — METHYLNALTREXONE 12 MG/0.6 ML SC SOLN [168825]: 12 mg | SUBCUTANEOUS | @ 18:00:00 | Stop: 2022-02-06 | NDC 65649055102

## 2022-02-06 MED ADMIN — METHYLNALTREXONE 12 MG/0.6 ML SC SOLN [168825]: 12 mg | SUBCUTANEOUS | @ 03:00:00 | Stop: 2022-02-06 | NDC 65649055102

## 2022-02-07 MED ADMIN — MAGNESIUM SULFATE IN D5W 1 GRAM/100 ML IV PGBK [166578]: 1 g | INTRAVENOUS | @ 14:00:00 | Stop: 2022-02-07 | NDC 44567041024

## 2022-02-07 MED ADMIN — GABAPENTIN 100 MG PO CAP [18309]: 100 mg | ORAL | @ 02:00:00 | NDC 00904666561

## 2022-02-07 MED ADMIN — MAGNESIUM SULFATE IN D5W 1 GRAM/100 ML IV PGBK [166578]: 1 g | INTRAVENOUS | @ 16:00:00 | Stop: 2022-02-07 | NDC 44567041024

## 2022-02-07 MED ADMIN — TAMSULOSIN 0.4 MG PO CAP [80077]: 0.4 mg | ORAL | @ 13:00:00 | NDC 00904640161

## 2022-02-07 MED ADMIN — SERTRALINE 25 MG PO TAB [81319]: 25 mg | ORAL | @ 02:00:00 | NDC 60687023111

## 2022-02-07 MED ADMIN — ACETAMINOPHEN 500 MG PO TAB [102]: 1000 mg | ORAL | @ 17:00:00 | NDC 00904673061

## 2022-02-07 MED ADMIN — POLYETHYLENE GLYCOL 3350 17 GRAM PO PWPK [25424]: 17 g | ORAL | @ 13:00:00 | NDC 00904693186

## 2022-02-07 MED ADMIN — SENNOSIDES 8.6 MG PO TAB [11349]: 1 | ORAL | @ 13:00:00 | NDC 00904725261

## 2022-02-07 MED ADMIN — APIXABAN 2.5 MG PO TAB [315777]: 2.5 mg | ORAL | @ 02:00:00 | NDC 00003089331

## 2022-02-07 MED ADMIN — KETOROLAC 15 MG/ML IJ SOLN [22472]: 15 mg | INTRAVENOUS | @ 15:00:00 | Stop: 2022-02-10 | NDC 72611071901

## 2022-02-07 MED ADMIN — PANTOPRAZOLE 40 MG PO TBEC [80436]: 40 mg | ORAL | @ 17:00:00 | NDC 00904647461

## 2022-02-07 MED ADMIN — PANTOPRAZOLE 40 MG PO TBEC [80436]: 40 mg | ORAL | @ 02:00:00 | NDC 00904647461

## 2022-02-07 MED ADMIN — ACETAMINOPHEN 500 MG PO TAB [102]: 1000 mg | ORAL | @ 11:00:00 | NDC 00904673061

## 2022-02-07 MED ADMIN — SENNOSIDES 8.6 MG PO TAB [11349]: 1 | ORAL | @ 02:00:00 | NDC 00904725261

## 2022-02-07 MED ADMIN — POLYETHYLENE GLYCOL 3350 17 GRAM PO PWPK [25424]: 17 g | ORAL | @ 02:00:00 | NDC 00904693186

## 2022-02-07 MED ADMIN — ALLOPURINOL 300 MG PO TAB [311]: 300 mg | ORAL | @ 13:00:00 | NDC 00904657261

## 2022-02-07 MED ADMIN — ACETAMINOPHEN 500 MG PO TAB [102]: 1000 mg | ORAL | @ 02:00:00 | NDC 00904673061

## 2022-02-07 MED ADMIN — APIXABAN 2.5 MG PO TAB [315777]: 2.5 mg | ORAL | @ 13:00:00 | NDC 00003089331

## 2022-02-08 ENCOUNTER — Observation Stay: Admit: 2022-02-08 | Discharge: 2022-02-08 | Payer: MEDICARE

## 2022-02-08 MED ADMIN — SENNOSIDES 8.6 MG PO TAB [11349]: 1 | ORAL | @ 02:00:00 | NDC 00904725261

## 2022-02-08 MED ADMIN — PANTOPRAZOLE 40 MG PO TBEC [80436]: 40 mg | ORAL | @ 02:00:00 | NDC 00904647461

## 2022-02-08 MED ADMIN — POLYETHYLENE GLYCOL 3350 17 GRAM PO PWPK [25424]: 17 g | ORAL | @ 14:00:00 | NDC 00904693186

## 2022-02-08 MED ADMIN — SENNOSIDES 8.6 MG PO TAB [11349]: 1 | ORAL | @ 14:00:00 | NDC 00904725261

## 2022-02-08 MED ADMIN — ACETAMINOPHEN 500 MG PO TAB [102]: 1000 mg | ORAL | @ 02:00:00 | NDC 00904673061

## 2022-02-08 MED ADMIN — ACETAMINOPHEN 500 MG PO TAB [102]: 1000 mg | ORAL | @ 11:00:00 | NDC 00904673061

## 2022-02-08 MED ADMIN — ACETAMINOPHEN 500 MG PO TAB [102]: 1000 mg | ORAL | @ 17:00:00 | NDC 00904673061

## 2022-02-08 MED ADMIN — PANTOPRAZOLE 40 MG PO TBEC [80436]: 40 mg | ORAL | @ 17:00:00 | Stop: 2022-02-10 | NDC 00904647461

## 2022-02-08 MED ADMIN — SERTRALINE 25 MG PO TAB [81319]: 25 mg | ORAL | @ 02:00:00 | NDC 60687023111

## 2022-02-08 MED ADMIN — POTASSIUM CHLORIDE 20 MEQ PO TBTQ [35943]: 20 meq | ORAL | @ 15:00:00 | Stop: 2022-02-08 | NDC 00832532511

## 2022-02-08 MED ADMIN — MELATONIN 5 MG PO TAB [168576]: 5 mg | ORAL | @ 02:00:00 | NDC 77333052025

## 2022-02-08 MED ADMIN — ALLOPURINOL 300 MG PO TAB [311]: 300 mg | ORAL | @ 14:00:00 | NDC 00904657261

## 2022-02-08 MED ADMIN — TAMSULOSIN 0.4 MG PO CAP [80077]: 0.4 mg | ORAL | @ 14:00:00 | NDC 00904640161

## 2022-02-08 MED ADMIN — APIXABAN 2.5 MG PO TAB [315777]: 2.5 mg | ORAL | @ 14:00:00 | NDC 00003089331

## 2022-02-08 MED ADMIN — APIXABAN 2.5 MG PO TAB [315777]: 2.5 mg | ORAL | @ 02:00:00 | NDC 00003089331

## 2022-02-08 MED ADMIN — GABAPENTIN 100 MG PO CAP [18309]: 100 mg | ORAL | @ 02:00:00 | NDC 00904666561

## 2022-02-09 MED ADMIN — ACETAMINOPHEN 500 MG PO TAB [102]: 1000 mg | ORAL | @ 16:00:00 | NDC 00904673061

## 2022-02-09 MED ADMIN — POLYETHYLENE GLYCOL 3350 17 GRAM PO PWPK [25424]: 17 g | ORAL | @ 01:00:00 | NDC 00904693186

## 2022-02-09 MED ADMIN — PANTOPRAZOLE 40 MG PO TBEC [80436]: 40 mg | ORAL | @ 01:00:00 | Stop: 2022-02-10 | NDC 00904647461

## 2022-02-09 MED ADMIN — SENNOSIDES 8.6 MG PO TAB [11349]: 1 | ORAL | @ 01:00:00 | NDC 00904725261

## 2022-02-09 MED ADMIN — POLYETHYLENE GLYCOL 3350 17 GRAM PO PWPK [25424]: 17 g | ORAL | @ 14:00:00 | NDC 00904693186

## 2022-02-09 MED ADMIN — ALLOPURINOL 300 MG PO TAB [311]: 300 mg | ORAL | @ 14:00:00 | NDC 00904657261

## 2022-02-09 MED ADMIN — APIXABAN 2.5 MG PO TAB [315777]: 2.5 mg | ORAL | @ 01:00:00 | NDC 00003089331

## 2022-02-09 MED ADMIN — PANTOPRAZOLE 40 MG PO TBEC [80436]: 40 mg | ORAL | @ 16:00:00 | Stop: 2022-02-10 | NDC 00904647461

## 2022-02-09 MED ADMIN — SENNOSIDES 8.6 MG PO TAB [11349]: 1 | ORAL | @ 14:00:00 | NDC 00904725261

## 2022-02-09 MED ADMIN — SERTRALINE 25 MG PO TAB [81319]: 25 mg | ORAL | @ 01:00:00 | NDC 60687023111

## 2022-02-09 MED ADMIN — ACETAMINOPHEN 500 MG PO TAB [102]: 1000 mg | ORAL | @ 01:00:00 | NDC 00904673061

## 2022-02-09 MED ADMIN — TAMSULOSIN 0.4 MG PO CAP [80077]: 0.4 mg | ORAL | @ 14:00:00 | NDC 68084029911

## 2022-02-09 MED ADMIN — APIXABAN 2.5 MG PO TAB [315777]: 2.5 mg | ORAL | @ 14:00:00 | NDC 00003089331

## 2022-02-09 MED ADMIN — GABAPENTIN 100 MG PO CAP [18309]: 100 mg | ORAL | @ 01:00:00 | NDC 00904666561

## 2022-02-09 MED ADMIN — ACETAMINOPHEN 500 MG PO TAB [102]: 1000 mg | ORAL | @ 11:00:00 | NDC 00904673061

## 2022-02-10 ENCOUNTER — Observation Stay: Admit: 2022-02-10 | Discharge: 2022-02-10 | Payer: MEDICARE

## 2022-02-10 MED ADMIN — ALLOPURINOL 300 MG PO TAB [311]: 300 mg | ORAL | @ 14:00:00 | NDC 00904657261

## 2022-02-10 MED ADMIN — APIXABAN 2.5 MG PO TAB [315777]: 2.5 mg | ORAL | @ 02:00:00 | NDC 00003089331

## 2022-02-10 MED ADMIN — ACETAMINOPHEN 500 MG PO TAB [102]: 1000 mg | ORAL | @ 02:00:00 | NDC 00904673061

## 2022-02-10 MED ADMIN — SERTRALINE 25 MG PO TAB [81319]: 25 mg | ORAL | @ 02:00:00 | NDC 60687023111

## 2022-02-10 MED ADMIN — MELATONIN 5 MG PO TAB [168576]: 5 mg | ORAL | @ 02:00:00 | NDC 77333052025

## 2022-02-10 MED ADMIN — ACETAMINOPHEN 500 MG PO TAB [102]: 1000 mg | ORAL | @ 10:00:00 | NDC 00904673061

## 2022-02-10 MED ADMIN — ONDANSETRON HCL (PF) 4 MG/2 ML IJ SOLN [136012]: 4 mg | INTRAVENOUS | @ 02:00:00 | NDC 72266012301

## 2022-02-10 MED ADMIN — POLYETHYLENE GLYCOL 3350 17 GRAM PO PWPK [25424]: 17 g | ORAL | @ 02:00:00 | NDC 00904693186

## 2022-02-10 MED ADMIN — CEFTRIAXONE INJ 1GM IVP [210253]: 1 g | INTRAVENOUS | @ 22:00:00 | NDC 60505614800

## 2022-02-10 MED ADMIN — PANTOPRAZOLE 40 MG PO TBEC [80436]: 40 mg | ORAL | @ 02:00:00 | Stop: 2022-02-10 | NDC 00904647461

## 2022-02-10 MED ADMIN — GABAPENTIN 100 MG PO CAP [18309]: 100 mg | ORAL | @ 02:00:00 | NDC 00904666561

## 2022-02-10 MED ADMIN — TAMSULOSIN 0.4 MG PO CAP [80077]: 0.4 mg | ORAL | @ 14:00:00 | NDC 68084029911

## 2022-02-10 MED ADMIN — SENNOSIDES 8.6 MG PO TAB [11349]: 1 | ORAL | @ 02:00:00 | NDC 00904725261

## 2022-02-10 MED ADMIN — ONDANSETRON 4 MG PO TBDI [82394]: 4 mg | ORAL | @ 10:00:00 | NDC 68462015740

## 2022-02-10 MED ADMIN — ACETAMINOPHEN 500 MG PO TAB [102]: 1000 mg | ORAL | @ 17:00:00 | NDC 00904673061

## 2022-02-10 MED ADMIN — APIXABAN 2.5 MG PO TAB [315777]: 2.5 mg | ORAL | @ 14:00:00 | NDC 00003089331

## 2022-02-11 MED ADMIN — GABAPENTIN 100 MG PO CAP [18309]: 100 mg | ORAL | @ 01:00:00 | NDC 00904666561

## 2022-02-11 MED ADMIN — TAMSULOSIN 0.4 MG PO CAP [80077]: 0.4 mg | ORAL | @ 14:00:00 | NDC 00904640161

## 2022-02-11 MED ADMIN — DICLOFENAC SODIUM 1 % TP GEL [168032]: 2 g | TOPICAL | @ 20:00:00 | NDC 00067815203

## 2022-02-11 MED ADMIN — SODIUM CHLORIDE 0.9 % IV PGBK (MB+) [95161]: 4.5 g | INTRAVENOUS | @ 21:00:00 | NDC 00338915930

## 2022-02-11 MED ADMIN — ACETAMINOPHEN 500 MG PO TAB [102]: 1000 mg | ORAL | @ 01:00:00 | NDC 00904673061

## 2022-02-11 MED ADMIN — APIXABAN 2.5 MG PO TAB [315777]: 2.5 mg | ORAL | @ 14:00:00 | NDC 00003089331

## 2022-02-11 MED ADMIN — MELATONIN 5 MG PO TAB [168576]: 5 mg | ORAL | @ 01:00:00 | NDC 77333052025

## 2022-02-11 MED ADMIN — ALLOPURINOL 300 MG PO TAB [311]: 300 mg | ORAL | @ 14:00:00 | NDC 00904657261

## 2022-02-11 MED ADMIN — PIPERACILLIN-TAZOBACTAM 4.5 GRAM IV SOLR [80419]: 4.5 g | INTRAVENOUS | @ 21:00:00 | NDC 60505615900

## 2022-02-11 MED ADMIN — SENNOSIDES 8.6 MG PO TAB [11349]: 1 | ORAL | @ 14:00:00 | NDC 00904725261

## 2022-02-11 MED ADMIN — APIXABAN 2.5 MG PO TAB [315777]: 2.5 mg | ORAL | @ 01:00:00 | NDC 00003089331

## 2022-02-11 MED ADMIN — ACETAMINOPHEN 500 MG PO TAB [102]: 1000 mg | ORAL | @ 18:00:00 | NDC 00904673061

## 2022-02-11 MED ADMIN — SERTRALINE 25 MG PO TAB [81319]: 25 mg | ORAL | @ 01:00:00 | NDC 60687023111

## 2022-02-11 MED ADMIN — CEFTRIAXONE INJ 1GM IVP [210253]: 1 g | INTRAVENOUS | @ 20:00:00 | Stop: 2022-02-11 | NDC 60505614800

## 2022-02-11 MED ADMIN — POLYETHYLENE GLYCOL 3350 17 GRAM PO PWPK [25424]: 17 g | ORAL | @ 14:00:00 | NDC 00904693186

## 2022-02-11 MED ADMIN — ACETAMINOPHEN 500 MG PO TAB [102]: 1000 mg | ORAL | @ 11:00:00 | NDC 00904673061

## 2022-02-12 ENCOUNTER — Inpatient Hospital Stay: Admit: 2022-02-12 | Discharge: 2022-02-12 | Payer: MEDICARE

## 2022-02-12 ENCOUNTER — Encounter: Admit: 2022-02-12 | Discharge: 2022-02-12 | Payer: MEDICARE

## 2022-02-12 MED ADMIN — PIPERACILLIN-TAZOBACTAM 4.5 GRAM IV SOLR [80419]: 4.5 g | INTRAVENOUS | @ 21:00:00 | NDC 60505615900

## 2022-02-12 MED ADMIN — ACETAMINOPHEN 500 MG PO TAB [102]: 1000 mg | ORAL | @ 01:00:00 | NDC 00904673061

## 2022-02-12 MED ADMIN — LACTATED RINGERS IV SOLP [4318]: 1000 mL | INTRAVENOUS | @ 22:00:00 | Stop: 2022-02-12 | NDC 00338011704

## 2022-02-12 MED ADMIN — SENNOSIDES 8.6 MG PO TAB [11349]: 1 | ORAL | @ 13:00:00 | NDC 00904725261

## 2022-02-12 MED ADMIN — POTASSIUM CHLORIDE 20 MEQ PO TBTQ [35943]: 40 meq | ORAL | @ 15:00:00 | Stop: 2022-02-12 | NDC 00832532511

## 2022-02-12 MED ADMIN — SODIUM CHLORIDE 0.9 % IV PGBK (MB+) [95161]: 4.5 g | INTRAVENOUS | @ 01:00:00 | NDC 00338915930

## 2022-02-12 MED ADMIN — PIPERACILLIN-TAZOBACTAM 4.5 GRAM IV SOLR [80419]: 4.5 g | INTRAVENOUS | @ 01:00:00 | NDC 60505615900

## 2022-02-12 MED ADMIN — GABAPENTIN 100 MG PO CAP [18309]: 100 mg | ORAL | @ 01:00:00 | NDC 00904666561

## 2022-02-12 MED ADMIN — SODIUM CHLORIDE 0.9 % IV PGBK (MB+) [95161]: 4.5 g | INTRAVENOUS | @ 13:00:00 | NDC 00338915930

## 2022-02-12 MED ADMIN — APIXABAN 2.5 MG PO TAB [315777]: 2.5 mg | ORAL | @ 01:00:00 | NDC 00003089331

## 2022-02-12 MED ADMIN — PIPERACILLIN-TAZOBACTAM 4.5 GRAM IV SOLR [80419]: 4.5 g | INTRAVENOUS | @ 13:00:00 | NDC 60505615900

## 2022-02-12 MED ADMIN — ALLOPURINOL 300 MG PO TAB [311]: 300 mg | ORAL | @ 13:00:00 | NDC 00904657261

## 2022-02-12 MED ADMIN — POLYETHYLENE GLYCOL 3350 17 GRAM PO PWPK [25424]: 17 g | ORAL | @ 13:00:00 | NDC 00904693186

## 2022-02-12 MED ADMIN — ACETAMINOPHEN 500 MG PO TAB [102]: 1000 mg | ORAL | @ 11:00:00 | NDC 00904673061

## 2022-02-12 MED ADMIN — MAGNESIUM SULFATE IN D5W 1 GRAM/100 ML IV PGBK [166578]: 1 g | INTRAVENOUS | @ 15:00:00 | Stop: 2022-02-12 | NDC 44567041024

## 2022-02-12 MED ADMIN — MAGNESIUM SULFATE IN D5W 1 GRAM/100 ML IV PGBK [166578]: 1 g | INTRAVENOUS | @ 19:00:00 | Stop: 2022-02-12 | NDC 44567041024

## 2022-02-12 MED ADMIN — ACETAMINOPHEN 500 MG PO TAB [102]: 1000 mg | ORAL | @ 17:00:00 | NDC 00904673061

## 2022-02-12 MED ADMIN — TAMSULOSIN 0.4 MG PO CAP [80077]: 0.4 mg | ORAL | @ 13:00:00 | NDC 00904640161

## 2022-02-12 MED ADMIN — SERTRALINE 25 MG PO TAB [81319]: 25 mg | ORAL | @ 01:00:00 | NDC 60687023111

## 2022-02-12 MED ADMIN — PIPERACILLIN-TAZOBACTAM 4.5 GRAM IV SOLR [80419]: 4.5 g | INTRAVENOUS | @ 08:00:00 | NDC 60505615900

## 2022-02-12 MED ADMIN — MELATONIN 5 MG PO TAB [168576]: 5 mg | ORAL | @ 01:00:00 | NDC 77333052025

## 2022-02-12 MED ADMIN — SODIUM CHLORIDE 0.9 % IV PGBK (MB+) [95161]: 4.5 g | INTRAVENOUS | @ 21:00:00 | NDC 00338915930

## 2022-02-12 MED ADMIN — SODIUM CHLORIDE 0.9 % IV PGBK (MB+) [95161]: 4.5 g | INTRAVENOUS | @ 08:00:00 | NDC 00338915930

## 2022-02-12 MED ADMIN — APIXABAN 2.5 MG PO TAB [315777]: 2.5 mg | ORAL | @ 13:00:00 | NDC 00003089331

## 2022-02-13 ENCOUNTER — Encounter: Admit: 2022-02-13 | Discharge: 2022-02-13 | Payer: MEDICARE

## 2022-02-13 ENCOUNTER — Inpatient Hospital Stay: Admit: 2022-02-13 | Discharge: 2022-02-13 | Payer: MEDICARE

## 2022-02-13 MED ADMIN — SENNOSIDES 8.6 MG PO TAB [11349]: 1 | ORAL | @ 01:00:00 | NDC 00904725261

## 2022-02-13 MED ADMIN — ACETAMINOPHEN 500 MG PO TAB [102]: 1000 mg | ORAL | @ 01:00:00 | NDC 00904673061

## 2022-02-13 MED ADMIN — SERTRALINE 25 MG PO TAB [81319]: 25 mg | ORAL | @ 01:00:00 | NDC 60687023111

## 2022-02-13 MED ADMIN — APIXABAN 2.5 MG PO TAB [315777]: 2.5 mg | ORAL | @ 14:00:00 | Stop: 2022-02-13 | NDC 00003089331

## 2022-02-13 MED ADMIN — SODIUM CHLORIDE 0.9 % IV PGBK (MB+) [95161]: 4.5 g | INTRAVENOUS | @ 01:00:00 | NDC 00338915930

## 2022-02-13 MED ADMIN — SODIUM CHLORIDE 0.9 % IJ SOLN [7319]: 50 mL | INTRAVENOUS | @ 13:00:00 | Stop: 2022-02-13 | NDC 00409488820

## 2022-02-13 MED ADMIN — SODIUM CHLORIDE 0.9 % IV PGBK (MB+) [95161]: 4.5 g | INTRAVENOUS | @ 08:00:00 | Stop: 2022-02-13 | NDC 00338915930

## 2022-02-13 MED ADMIN — IOHEXOL 350 MG IODINE/ML IV SOLN [81210]: 100 mL | INTRAVENOUS | @ 13:00:00 | Stop: 2022-02-13 | NDC 00407141491

## 2022-02-13 MED ADMIN — SODIUM CHLORIDE 0.9 % IV PGBK (MB+) [95161]: 4.5 g | INTRAVENOUS | @ 14:00:00 | Stop: 2022-02-13 | NDC 00338915930

## 2022-02-13 MED ADMIN — ACETAMINOPHEN 500 MG PO TAB [102]: 1000 mg | ORAL | @ 17:00:00 | Stop: 2022-02-13 | NDC 00904673061

## 2022-02-13 MED ADMIN — ALLOPURINOL 300 MG PO TAB [311]: 300 mg | ORAL | @ 14:00:00 | Stop: 2022-02-13 | NDC 00904657261

## 2022-02-13 MED ADMIN — PIPERACILLIN-TAZOBACTAM 4.5 GRAM IV SOLR [80419]: 4.5 g | INTRAVENOUS | @ 01:00:00 | NDC 60505615900

## 2022-02-13 MED ADMIN — ACETAMINOPHEN 500 MG PO TAB [102]: 1000 mg | ORAL | @ 12:00:00 | Stop: 2022-02-13 | NDC 00904673061

## 2022-02-13 MED ADMIN — GABAPENTIN 100 MG PO CAP [18309]: 100 mg | ORAL | @ 01:00:00 | NDC 00904666561

## 2022-02-13 MED ADMIN — POLYETHYLENE GLYCOL 3350 17 GRAM PO PWPK [25424]: 17 g | ORAL | @ 01:00:00 | NDC 00904693186

## 2022-02-13 MED ADMIN — APIXABAN 2.5 MG PO TAB [315777]: 2.5 mg | ORAL | @ 01:00:00 | NDC 00003089331

## 2022-02-13 MED ADMIN — PIPERACILLIN-TAZOBACTAM 4.5 GRAM IV SOLR [80419]: 4.5 g | INTRAVENOUS | @ 14:00:00 | Stop: 2022-02-13 | NDC 60505615900

## 2022-02-13 MED ADMIN — PIPERACILLIN-TAZOBACTAM 4.5 GRAM IV SOLR [80419]: 4.5 g | INTRAVENOUS | @ 08:00:00 | Stop: 2022-02-13 | NDC 60505615900

## 2022-02-13 MED ADMIN — SENNOSIDES 8.6 MG PO TAB [11349]: 1 | ORAL | @ 14:00:00 | Stop: 2022-02-13 | NDC 00904725261

## 2022-02-13 MED ADMIN — TAMSULOSIN 0.4 MG PO CAP [80077]: 0.4 mg | ORAL | @ 14:00:00 | Stop: 2022-02-13 | NDC 68084029911

## 2022-03-05 ENCOUNTER — Encounter: Admit: 2022-03-05 | Discharge: 2022-03-05 | Payer: MEDICARE

## 2022-03-05 NOTE — Telephone Encounter
Patient's daughter called and stated he has an appt scheduled 10/13 and he has a foley. She reports that his catheter is hurting and requested a callback. returned her call. She reported pain at the tip of his penis. Recommended a moisture barrier cream like bacitracin or neosporin to help with discomfort until appointment.
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
## 2022-03-15 ENCOUNTER — Ambulatory Visit: Admit: 2022-03-15 | Discharge: 2022-03-15 | Payer: MEDICARE

## 2022-03-15 ENCOUNTER — Encounter: Admit: 2022-03-15 | Discharge: 2022-03-15 | Payer: MEDICARE

## 2022-03-15 DIAGNOSIS — N200 Calculus of kidney: Secondary | ICD-10-CM

## 2022-03-15 DIAGNOSIS — R339 Retention of urine, unspecified: Secondary | ICD-10-CM

## 2022-03-15 DIAGNOSIS — N401 Enlarged prostate with lower urinary tract symptoms: Secondary | ICD-10-CM

## 2022-03-15 MED ORDER — TAMSULOSIN 0.4 MG PO CAP
.4 mg | ORAL_CAPSULE | Freq: Every day | ORAL | 3 refills | 90.00000 days | Status: AC
Start: 2022-03-15 — End: ?

## 2022-03-15 MED ORDER — FINASTERIDE 5 MG PO TAB
5 mg | ORAL_TABLET | Freq: Every day | ORAL | 3 refills | Status: AC
Start: 2022-03-15 — End: ?

## 2022-03-15 NOTE — Progress Notes
Preformed voiding trial, inserted 250 ml sterile water. Pt voided 150 mL.      1428: patient voided another 100 mL

## 2022-05-13 ENCOUNTER — Ambulatory Visit: Admit: 2022-05-13 | Discharge: 2022-05-14 | Payer: MEDICARE

## 2022-05-13 ENCOUNTER — Encounter: Admit: 2022-05-13 | Discharge: 2022-05-13 | Payer: MEDICARE

## 2022-05-13 DIAGNOSIS — B353 Tinea pedis: Secondary | ICD-10-CM

## 2022-05-13 DIAGNOSIS — I8393 Asymptomatic varicose veins of bilateral lower extremities: Secondary | ICD-10-CM

## 2022-05-13 DIAGNOSIS — I82409 Acute embolism and thrombosis of unspecified deep veins of unspecified lower extremity: Secondary | ICD-10-CM

## 2022-05-13 DIAGNOSIS — I6523 Occlusion and stenosis of bilateral carotid arteries: Secondary | ICD-10-CM

## 2022-05-13 DIAGNOSIS — E785 Hyperlipidemia, unspecified: Secondary | ICD-10-CM

## 2022-05-13 DIAGNOSIS — M353 Polymyalgia rheumatica: Secondary | ICD-10-CM

## 2022-05-13 DIAGNOSIS — I714 AAA (abdominal aortic aneurysm) (HCC): Secondary | ICD-10-CM

## 2022-05-13 DIAGNOSIS — M1990 Unspecified osteoarthritis, unspecified site: Secondary | ICD-10-CM

## 2022-05-13 DIAGNOSIS — B354 Tinea corporis: Secondary | ICD-10-CM

## 2022-05-13 DIAGNOSIS — N400 Enlarged prostate without lower urinary tract symptoms: Secondary | ICD-10-CM

## 2022-05-13 DIAGNOSIS — M5030 Other cervical disc degeneration, unspecified cervical region: Secondary | ICD-10-CM

## 2022-05-13 DIAGNOSIS — B351 Tinea unguium: Secondary | ICD-10-CM

## 2022-05-13 DIAGNOSIS — S060XAA Concussion: Secondary | ICD-10-CM

## 2022-05-13 MED ORDER — TERBINAFINE HCL 250 MG PO TAB
250 mg | ORAL_TABLET | Freq: Every day | ORAL | 0 refills | 29.00000 days | Status: AC
Start: 2022-05-13 — End: ?

## 2022-06-18 ENCOUNTER — Encounter: Admit: 2022-06-18 | Discharge: 2022-06-18 | Payer: MEDICARE
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
## 2022-07-05 ENCOUNTER — Ambulatory Visit: Admit: 2022-07-05 | Discharge: 2022-07-05 | Payer: MEDICARE

## 2022-07-05 ENCOUNTER — Encounter: Admit: 2022-07-05 | Discharge: 2022-07-05 | Payer: MEDICARE

## 2022-07-05 DIAGNOSIS — N400 Enlarged prostate without lower urinary tract symptoms: Secondary | ICD-10-CM

## 2022-07-05 DIAGNOSIS — I82409 Acute embolism and thrombosis of unspecified deep veins of unspecified lower extremity: Secondary | ICD-10-CM

## 2022-07-05 DIAGNOSIS — I6523 Occlusion and stenosis of bilateral carotid arteries: Secondary | ICD-10-CM

## 2022-07-05 DIAGNOSIS — S060XAA Concussion: Secondary | ICD-10-CM

## 2022-07-05 DIAGNOSIS — I8393 Asymptomatic varicose veins of bilateral lower extremities: Secondary | ICD-10-CM

## 2022-07-05 DIAGNOSIS — N200 Calculus of kidney: Secondary | ICD-10-CM

## 2022-07-05 DIAGNOSIS — I714 AAA (abdominal aortic aneurysm) (HCC): Secondary | ICD-10-CM

## 2022-07-05 DIAGNOSIS — M1990 Unspecified osteoarthritis, unspecified site: Secondary | ICD-10-CM

## 2022-07-05 DIAGNOSIS — M353 Polymyalgia rheumatica: Secondary | ICD-10-CM

## 2022-07-05 DIAGNOSIS — M5030 Other cervical disc degeneration, unspecified cervical region: Secondary | ICD-10-CM

## 2022-07-05 DIAGNOSIS — E785 Hyperlipidemia, unspecified: Secondary | ICD-10-CM

## 2022-07-29 ENCOUNTER — Encounter: Admit: 2022-07-29 | Discharge: 2022-07-29 | Payer: MEDICARE

## 2022-09-12 ENCOUNTER — Encounter: Admit: 2022-09-12 | Discharge: 2022-09-12 | Payer: MEDICARE

## 2022-09-13 NOTE — Patient Instructions
It was a pleasure seeing you in clinic today.  Please don't hesitate to call or send a MyChart message if you have any questions.     DO NOT call or send a MyChart message asking for any test or imaging (MRI, CT, etc.) results.  All results will be discussed with you at your follow-up appointment.     Candance Bohlman BSN, RN, CNOR  Clinical Nurse Coordinator  Dr. Brandon Carlson  Alexsis Johnson APRN-NP   Jenna Batchelder PA  Caleb Darland PA  Troy Stucker PA  [facility]  [facility]  [address]  [place], [place] [address]  [email or web address]  Phone: [phone]  Fax:  [phone]  Scheduling [phone]

## 2022-09-17 ENCOUNTER — Encounter: Admit: 2022-09-17 | Discharge: 2022-09-17 | Payer: MEDICARE

## 2022-09-17 DIAGNOSIS — M545 Lumbar pain: Secondary | ICD-10-CM

## 2022-09-23 ENCOUNTER — Encounter: Admit: 2022-09-23 | Discharge: 2022-09-23 | Payer: MEDICARE

## 2022-09-23 NOTE — Telephone Encounter
Attempted to call preferred phone [phone] - no answer and not given the opportunity to LVM    Called [phone] and LVM for Joshua Caldwell  Reminded patient of upcoming appointment. Instructed them to complete check-in/questionnaires on MyChart prior to visit. If unable to complete on MyChart then to arrive 30 minutes early to complete in person. Stated that we would be getting a new x-ray of their spine during the visit. Also stated for patient to bring any spine related imaging on a disc to appointment if they have any. Left nurse's number for questions.

## 2022-09-24 ENCOUNTER — Encounter: Admit: 2022-09-24 | Discharge: 2022-09-24 | Payer: MEDICARE

## 2022-09-24 ENCOUNTER — Ambulatory Visit: Admit: 2022-09-24 | Discharge: 2022-09-24 | Payer: MEDICARE

## 2022-09-24 DIAGNOSIS — I6523 Occlusion and stenosis of bilateral carotid arteries: Secondary | ICD-10-CM

## 2022-09-24 DIAGNOSIS — N200 Calculus of kidney: Secondary | ICD-10-CM

## 2022-09-24 DIAGNOSIS — I8393 Asymptomatic varicose veins of bilateral lower extremities: Secondary | ICD-10-CM

## 2022-09-24 DIAGNOSIS — M353 Polymyalgia rheumatica: Secondary | ICD-10-CM

## 2022-09-24 DIAGNOSIS — M545 Lumbar pain: Secondary | ICD-10-CM

## 2022-09-24 DIAGNOSIS — D539 Nutritional anemia, unspecified: Secondary | ICD-10-CM

## 2022-09-24 DIAGNOSIS — N400 Enlarged prostate without lower urinary tract symptoms: Secondary | ICD-10-CM

## 2022-09-24 DIAGNOSIS — E785 Hyperlipidemia, unspecified: Secondary | ICD-10-CM

## 2022-09-24 DIAGNOSIS — I2699 Other pulmonary embolism without acute cor pulmonale: Secondary | ICD-10-CM

## 2022-09-24 DIAGNOSIS — M48061 Spinal stenosis, lumbar region without neurogenic claudication: Secondary | ICD-10-CM

## 2022-09-24 DIAGNOSIS — S060XAA Concussion: Secondary | ICD-10-CM

## 2022-09-24 DIAGNOSIS — M5030 Other cervical disc degeneration, unspecified cervical region: Secondary | ICD-10-CM

## 2022-09-24 DIAGNOSIS — M5416 Radiculopathy, lumbar region: Secondary | ICD-10-CM

## 2022-09-24 DIAGNOSIS — I714 AAA (abdominal aortic aneurysm) (HCC): Secondary | ICD-10-CM

## 2022-09-24 DIAGNOSIS — M2550 Pain in unspecified joint: Secondary | ICD-10-CM

## 2022-09-24 DIAGNOSIS — I82409 Acute embolism and thrombosis of unspecified deep veins of unspecified lower extremity: Secondary | ICD-10-CM

## 2022-09-24 DIAGNOSIS — H269 Unspecified cataract: Secondary | ICD-10-CM

## 2022-09-24 DIAGNOSIS — M1990 Unspecified osteoarthritis, unspecified site: Secondary | ICD-10-CM

## 2022-09-24 DIAGNOSIS — M5136 Other intervertebral disc degeneration, lumbar region: Secondary | ICD-10-CM

## 2022-09-24 DIAGNOSIS — M4316 Spondylolisthesis, lumbar region: Secondary | ICD-10-CM
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
## 2022-10-02 ENCOUNTER — Encounter: Admit: 2022-10-02 | Discharge: 2022-10-02 | Payer: MEDICARE

## 2022-10-02 DIAGNOSIS — M5416 Radiculopathy, lumbar region: Secondary | ICD-10-CM

## 2022-10-02 DIAGNOSIS — M5136 Other intervertebral disc degeneration, lumbar region: Secondary | ICD-10-CM

## 2022-10-02 DIAGNOSIS — M4316 Spondylolisthesis, lumbar region: Secondary | ICD-10-CM

## 2022-10-02 DIAGNOSIS — M48061 Spinal stenosis, lumbar region without neurogenic claudication: Secondary | ICD-10-CM

## 2022-10-03 ENCOUNTER — Encounter: Admit: 2022-10-03 | Discharge: 2022-10-03 | Payer: MEDICARE

## 2022-10-04 ENCOUNTER — Encounter: Admit: 2022-10-04 | Discharge: 2022-10-04 | Payer: MEDICARE

## 2022-10-08 ENCOUNTER — Ambulatory Visit: Admit: 2022-10-08 | Discharge: 2022-10-09 | Payer: MEDICARE

## 2022-10-08 ENCOUNTER — Encounter: Admit: 2022-10-08 | Discharge: 2022-10-08 | Payer: MEDICARE

## 2022-10-08 DIAGNOSIS — I8393 Asymptomatic varicose veins of bilateral lower extremities: Secondary | ICD-10-CM

## 2022-10-08 DIAGNOSIS — S060XAA Concussion: Secondary | ICD-10-CM

## 2022-10-08 DIAGNOSIS — N200 Calculus of kidney: Secondary | ICD-10-CM

## 2022-10-08 DIAGNOSIS — I82409 Acute embolism and thrombosis of unspecified deep veins of unspecified lower extremity: Secondary | ICD-10-CM

## 2022-10-08 DIAGNOSIS — M545 Lumbar pain: Secondary | ICD-10-CM

## 2022-10-08 DIAGNOSIS — M4316 Spondylolisthesis, lumbar region: Secondary | ICD-10-CM

## 2022-10-08 DIAGNOSIS — D539 Nutritional anemia, unspecified: Secondary | ICD-10-CM

## 2022-10-08 DIAGNOSIS — N400 Enlarged prostate without lower urinary tract symptoms: Secondary | ICD-10-CM

## 2022-10-08 DIAGNOSIS — E785 Hyperlipidemia, unspecified: Secondary | ICD-10-CM

## 2022-10-08 DIAGNOSIS — I2699 Other pulmonary embolism without acute cor pulmonale: Secondary | ICD-10-CM

## 2022-10-08 DIAGNOSIS — M1990 Unspecified osteoarthritis, unspecified site: Secondary | ICD-10-CM

## 2022-10-08 DIAGNOSIS — M48061 Spinal stenosis, lumbar region without neurogenic claudication: Secondary | ICD-10-CM

## 2022-10-08 DIAGNOSIS — M5416 Radiculopathy, lumbar region: Secondary | ICD-10-CM

## 2022-10-08 DIAGNOSIS — I714 AAA (abdominal aortic aneurysm) (HCC): Secondary | ICD-10-CM

## 2022-10-08 DIAGNOSIS — M5030 Other cervical disc degeneration, unspecified cervical region: Secondary | ICD-10-CM

## 2022-10-08 DIAGNOSIS — M5136 Other intervertebral disc degeneration, lumbar region: Secondary | ICD-10-CM

## 2022-10-08 DIAGNOSIS — M353 Polymyalgia rheumatica: Secondary | ICD-10-CM

## 2022-10-08 DIAGNOSIS — H269 Unspecified cataract: Secondary | ICD-10-CM

## 2022-10-08 DIAGNOSIS — I6523 Occlusion and stenosis of bilateral carotid arteries: Secondary | ICD-10-CM

## 2022-10-08 DIAGNOSIS — M2550 Pain in unspecified joint: Secondary | ICD-10-CM

## 2022-10-08 NOTE — Progress Notes
Marc A. Clydene Pugh, MD [facility]  Follow - Up Visit  Subjective     REASON FOR VISIT   Pain of the Lower Back, Pain of the Right Hip, Pain of the Left Hip, Pain of the Left Leg (Come and go), and Other (MRI review)    SUBJECTIVE     Mr. Joshua Caldwell returns today to review MRI findings.  His worst pain is in the left proximal buttock region.         ROS: Review of Systems   Musculoskeletal:  Positive for arthralgias and back pain.   All other systems reviewed and are negative.    A 10-point ROS was performed and negative.    PHYSICAL EXAM   Blood pressure 130/63, pulse 67, temperature 36.3 ?C (97.3 ?F), temperature source Temporal, height 170.2 cm (5' 7), weight 74.6 kg (164 lb 6.4 oz), SpO2 99%.  Body mass index is 25.75 kg/m?Marland Kitchen  Oswestry Total Score:: 46  Pain Score: Six    Constitutional: Alert, NAD  Head: Atraumatic  Eyes: EOMI  Respiratory: Unlabored breathing  Cardiovascular: Regular rate  Skin: No rashes or open wounds appreciated on back  Musculoskeletal: Strength stable  Neurologic: Sensation stable    Unchanged.    RADIOGRAPHS     MRI demonstrates multi-level degenerative disc disease lumbar spine.  He has severe foraminal stenosis on the left at L4-5 and L5-S1.       ASSESSMENT / PLAN     TANNAR BROKER is a 89 y.o. male with:      1. Spondylolisthesis of lumbar region  [place] AMB SPINE INJECT SNRB LUMBAR/SACRAL      2. DDD (degenerative disc disease), lumbar  [place] AMB SPINE INJECT SNRB LUMBAR/SACRAL      3. Lumbar radicular pain  [place] AMB SPINE INJECT SNRB LUMBAR/SACRAL      4. Spinal stenosis of lumbar region with radiculopathy  [place] AMB SPINE INJECT SNRB LUMBAR/SACRAL      5. Lumbar pain  [place] AMB SPINE INJECT SNRB LUMBAR/SACRAL      6. Foraminal stenosis of lumbar region  [place] AMB SPINE INJECT SNRB LUMBAR/SACRAL      7. Stenosis of lateral recess of lumbar spine  [place] AMB SPINE INJECT SNRB LUMBAR/SACRAL          I think there is a surgery that could be considered in the future, but I think the first step at this point wold be to try an injection.  He is fine with that plan.  We will target the left side with transforaminals at L4-5 and L5-S1.  We will plan to follow up with him 6-8 weeks after the injection to evaluate his response.         In the presence of Marcelline Deist, MD , I have taken down these notes, Mamie Laurel, Scribe. Oct 08, 2022 3:12 PM    Apolinar Junes B. Lisette Grinder, MD, MPH  Spinal Surgery  [place]. Clydene Pugh MD, [facility]  Nurse: Laverle Patter, BSN, RN, CNOR   [phone]  -  [email or web address]

## 2022-10-22 ENCOUNTER — Encounter: Admit: 2022-10-22 | Discharge: 2022-10-22 | Payer: MEDICARE

## 2022-10-23 ENCOUNTER — Encounter: Admit: 2022-10-23 | Discharge: 2022-10-23 | Payer: MEDICARE

## 2022-10-23 ENCOUNTER — Ambulatory Visit: Admit: 2022-10-23 | Discharge: 2022-10-23 | Payer: MEDICARE

## 2022-10-23 DIAGNOSIS — M5136 Other intervertebral disc degeneration, lumbar region: Secondary | ICD-10-CM

## 2022-10-23 DIAGNOSIS — M353 Polymyalgia rheumatica: Secondary | ICD-10-CM

## 2022-10-23 DIAGNOSIS — I82409 Acute embolism and thrombosis of unspecified deep veins of unspecified lower extremity: Secondary | ICD-10-CM

## 2022-10-23 DIAGNOSIS — I2699 Other pulmonary embolism without acute cor pulmonale: Secondary | ICD-10-CM

## 2022-10-23 DIAGNOSIS — M5030 Other cervical disc degeneration, unspecified cervical region: Secondary | ICD-10-CM

## 2022-10-23 DIAGNOSIS — N200 Calculus of kidney: Secondary | ICD-10-CM

## 2022-10-23 DIAGNOSIS — I8393 Asymptomatic varicose veins of bilateral lower extremities: Secondary | ICD-10-CM

## 2022-10-23 DIAGNOSIS — M2550 Pain in unspecified joint: Secondary | ICD-10-CM

## 2022-10-23 DIAGNOSIS — E785 Hyperlipidemia, unspecified: Secondary | ICD-10-CM

## 2022-10-23 DIAGNOSIS — H269 Unspecified cataract: Secondary | ICD-10-CM

## 2022-10-23 DIAGNOSIS — M545 Lumbar pain: Secondary | ICD-10-CM

## 2022-10-23 DIAGNOSIS — I6523 Occlusion and stenosis of bilateral carotid arteries: Secondary | ICD-10-CM

## 2022-10-23 DIAGNOSIS — D539 Nutritional anemia, unspecified: Secondary | ICD-10-CM

## 2022-10-23 DIAGNOSIS — M48061 Spinal stenosis, lumbar region without neurogenic claudication: Secondary | ICD-10-CM

## 2022-10-23 DIAGNOSIS — M4316 Spondylolisthesis, lumbar region: Secondary | ICD-10-CM

## 2022-10-23 DIAGNOSIS — I714 AAA (abdominal aortic aneurysm) (HCC): Secondary | ICD-10-CM

## 2022-10-23 DIAGNOSIS — M5416 Radiculopathy, lumbar region: Secondary | ICD-10-CM

## 2022-10-23 DIAGNOSIS — N400 Enlarged prostate without lower urinary tract symptoms: Secondary | ICD-10-CM

## 2022-10-23 DIAGNOSIS — S060XAA Concussion: Secondary | ICD-10-CM

## 2022-10-23 DIAGNOSIS — M1990 Unspecified osteoarthritis, unspecified site: Secondary | ICD-10-CM

## 2022-10-23 MED ORDER — DEXAMETHASONE SODIUM PHOS (PF) 10 MG/ML IJ EPIDURAL SOLN
15 mg | Freq: Once | EPIDURAL | 0 refills | Status: CP
Start: 2022-10-23 — End: ?

## 2022-10-23 MED ORDER — IOHEXOL 300 MG IODINE/ML IV SOLN
1 mL | Freq: Once | 0 refills | Status: CP
Start: 2022-10-23 — End: ?

## 2022-10-23 MED ORDER — SODIUM CHLORIDE 0.9 % IJ SOLN
3 mL | Freq: Once | INTRAMUSCULAR | 0 refills | Status: CP
Start: 2022-10-23 — End: ?
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
## 2022-10-23 NOTE — Discharge Instructions - Supplementary Instructions
GENERAL POST PROCEDURE INSTRUCTIONS  Physician: _________________________________  Procedure Completed Today:  Joint Injection (hip, knee, shoulder)  Cervical Epidural Steroid Injection  Cervical Transforaminal Steroid Injection  Trigger Point Injection  Caudal Epidural Steroid Injection  Piriformis Injection  Pudendal Nerve Block  Other _____________________ Thoracic Epidural Steroid Injection  Lumbar Epidural Steroid Injection  Lumbar Transforaminal Steroid Injection  Facet Joint Injection  Celiac Nerve Block  Sacrococcygeal  Sacroiliac Joint Injection   Important information following your procedure today:  You may drive today     If you had sedation, you may NOT drive today  Rest at home for the next 6 hours.  You may then begin to resume your normal activities.  DO NOT drive any vehicle, operate any power tools, drink alcohol, make any major decisions, or sign any legal documents for the next 12 hours.  Pain relief may not be immediate. It is possible you may even experience an increase in pain during the first 24-48 hours followed by a gradual decrease of your pain.  Though the procedure is generally safe, and complications are rare, we do ask that you be aware of any of the following:  Any swelling, persistent redness, new bleeding or drainage from the site of the injection.  You should not experience a severe headache.  You should not run a fever over 101oF.  New onset of sharp, severe back and or neck pain.  New onset of upper or lower extremity numbness or weakness.  New difficulty controlling bowel or bladder function after injection.  New shortness of breath.  ** If any of these occur, please call to report this occurrence to the nurse of Dr. Sayed at [phone]. If you are calling after 4:00 p.m. or on weekends or holidays, please call [phone] and ask to have the resident physician on call for the physician paged or go to your local emergency room.  You may experience soreness at the injection site. Ice can be applied at 20-minute intervals for the first 24 hours. The following day you may alternate ice with heat if you are experiencing muscle tightness, otherwise continue with ice. Ice works best at decreasing pain. Avoid application of direct heat, hot showers or hot tubs today.  Avoid strenuous activity today. You many resume your regular activities and exercise tomorrow.  Patients with diabetes may see an elevation in blood sugars for 7-10 days after the injection. It is important to pay close attention to your diet, check your blood sugars daily and report extreme elevations to the physician that manages your diabetes.  Patients taking daily blood thinners can resume their regular dose this evening.  It is important that you take all medications ordered by your pain physician. Taking medications as ordered is an important part of your pain care plan. If you cannot continue the medication plan, please notify the physician.    Possible side effects to steroids that may occur:  Flushing or redness of the face  Irritability  Fluid retention  Change in women's menses  Minor headache    If you are unable to keep your upcoming appointment, please notify the [facility] scheduler at [phone] at least 24 hours in advance. If you have questions for the surgery center, call [facility] at [phone].

## 2022-10-23 NOTE — Procedures
Attending Surgeon: Bella Kennedy, MD    Anesthesia: Local    Pre-Procedure Diagnosis:   1. Lumbar radicular pain    2. Spondylolisthesis of lumbar region    3. DDD (degenerative disc disease), lumbar    4. Spinal stenosis of lumbar region with radiculopathy    5. Lumbar pain    6. Foraminal stenosis of lumbar region    7. Stenosis of lateral recess of lumbar spine        Post-Procedure Diagnosis:   1. Lumbar radicular pain    2. Spondylolisthesis of lumbar region    3. DDD (degenerative disc disease), lumbar    4. Spinal stenosis of lumbar region with radiculopathy    5. Lumbar pain    6. Foraminal stenosis of lumbar region    7. Stenosis of lateral recess of lumbar spine        [place] [facility] SPINE INJECT SNRB LUMBAR/SACRAL  Procedure: transforaminal epidural    Laterality: left   on 10/23/2022 11:03 AM  Location: lumbar - L4-5 and L5-S1      Consent:   Consent obtained: verbal and written  Consent given by: patient  Risks discussed: allergic reaction, bleeding, bruising, infection, nerve damage, no change or worsening in pain, weakness, swelling and reaction to medication  Alternatives discussed: alternative treatment and delayed treatment  Discussed with patient the purpose of the treatment/procedure, other ways of treating my condition, including no treatment/ procedure and the risks and benefits of the alternatives. Patient has decided to proceed with treatment/procedure.        Universal Protocol:  Relevant documents: relevant documents present and verified  Test results: test results available and properly labeled  Imaging studies: imaging studies available  Required items: required blood products, implants, devices, and special equipment available  Site marked: the operative site was marked  Patient identity confirmed: Patient identify confirmed verbally with patient.        Time out: Immediately prior to procedure a time out was called to verify the correct patient, procedure, equipment, support staff and site/side marked as required      Procedures Details:   Indications: pain   Prep: chlorhexidine  Patient position: prone  Estimated Blood Loss: minimal  Specimens: none  Number of Levels: 2  Approach: left paramedian  Guidance: fluoroscopy  Contrast: Procedure confirmed with contrast under live fluoroscopy.  Needle size: 25 G  Injection procedure: Incremental injection and Negative aspiration for blood  Amount Injected:   L4-5: 2mL  L5-S1: 2mL  Patient tolerance: Patient tolerated the procedure well with no immediate complications. Pressure was applied, and hemostasis was accomplished.  Outcome: Pain unchanged  Comments: 7.5mg  of dexamethasone and 2ml of normal saline injected at each level    This patient's clinical history, exam, AND imaging support radiculopathy AND there is a significant impact on quality of life and function AND the pain has been present for at least 4 weeks AND they have failed to improve with noninvasive conservative care.        Estimated blood loss: none or minimal  Specimens: none  Patient tolerated the procedure well with no immediate complications. Pressure was applied, and hemostasis was accomplished.  Administrations This Visit       dexamethasone PF (DECADRON) epidural injection 15 mg       Admin Date  10/23/2022 Action  Given Dose  15 mg Route  Epidural Documented By  Bella Kennedy, MD              iohexoL (  OMNIPAQUE-300) 300 mg/mL injection 1 mL       Admin Date  10/23/2022 Action  Given Dose  1 mL Route  SEE ADMIN INSTRUCTIONS Documented By  Bella Kennedy, MD              sodium chloride PF 0.9% injection 3 mL       Admin Date  10/23/2022 Action  Given Dose  3 mL Route  Injection Documented By  Bella Kennedy, MD

## 2022-10-26 ENCOUNTER — Encounter: Admit: 2022-10-26 | Discharge: 2022-10-26 | Payer: MEDICARE

## 2022-10-29 ENCOUNTER — Encounter: Admit: 2022-10-29 | Discharge: 2022-10-29 | Payer: MEDICARE

## 2022-12-02 ENCOUNTER — Encounter: Admit: 2022-12-02 | Discharge: 2022-12-02 | Payer: MEDICARE

## 2022-12-19 ENCOUNTER — Encounter: Admit: 2022-12-19 | Discharge: 2022-12-19 | Payer: MEDICARE

## 2023-01-07 ENCOUNTER — Encounter: Admit: 2023-01-07 | Discharge: 2023-01-07 | Payer: MEDICARE

## 2023-01-07 ENCOUNTER — Ambulatory Visit: Admit: 2023-01-07 | Discharge: 2023-01-07 | Payer: MEDICARE

## 2023-01-07 DIAGNOSIS — N200 Calculus of kidney: Secondary | ICD-10-CM

## 2023-01-17 ENCOUNTER — Encounter: Admit: 2023-01-17 | Discharge: 2023-01-17 | Payer: MEDICARE

## 2023-02-01 IMAGING — CT KNEE(Adult)
3 of 4 series · 11 of 33 positions shown, 13 images · non-contrast
Comparison: none

[Series 12: knee ax 2.00 br60 s3 · axial · 0.37mm/px · z∈[+1161,+1345]mm · 3 of 161 slices shown, 4 images]
[im 46/161  soft-tissue]
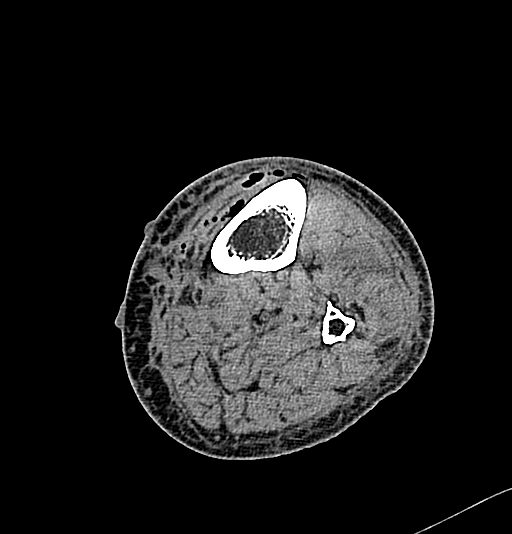
[im 46/161  bone]
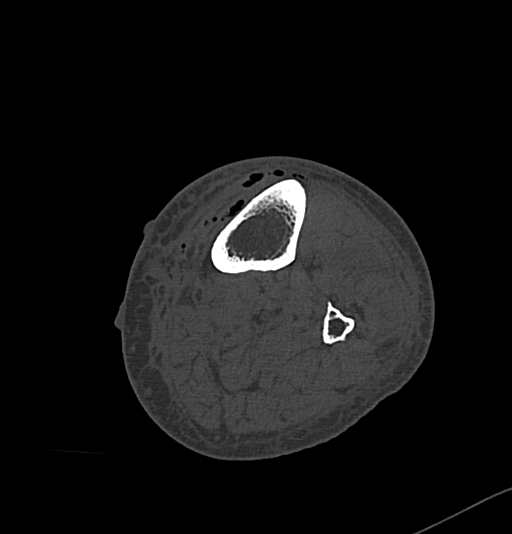
[im 92/161  bone]
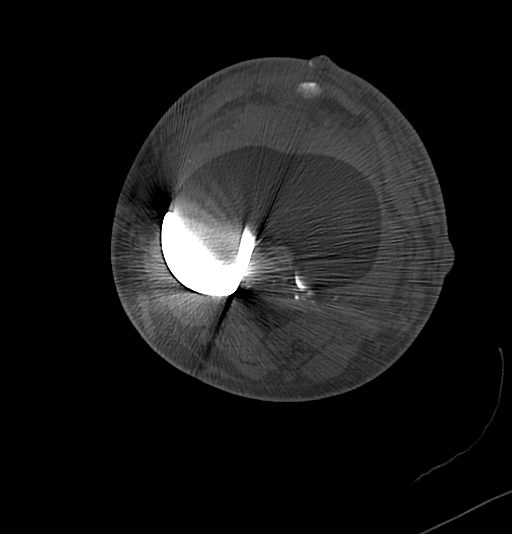
[im 138/161  bone]
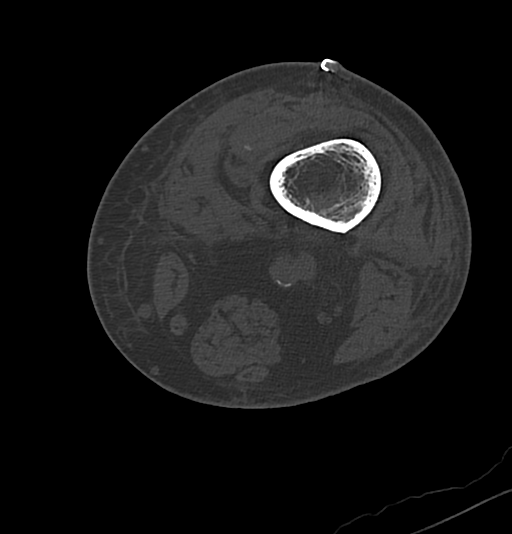

[Series 14: knee cor 2.00 br60 s3 · coronal · 0.37mm/px · 3 of 97 slices shown]
[im 20/97  bone]
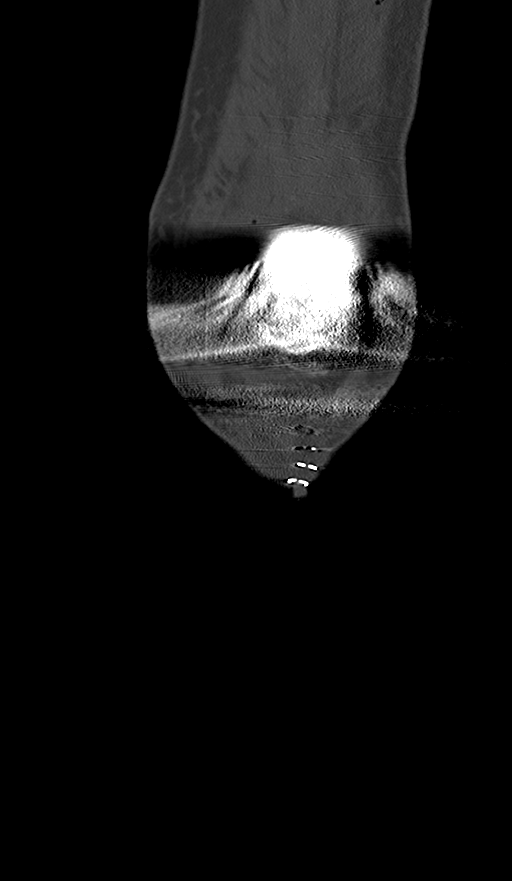
[im 39/97  bone]
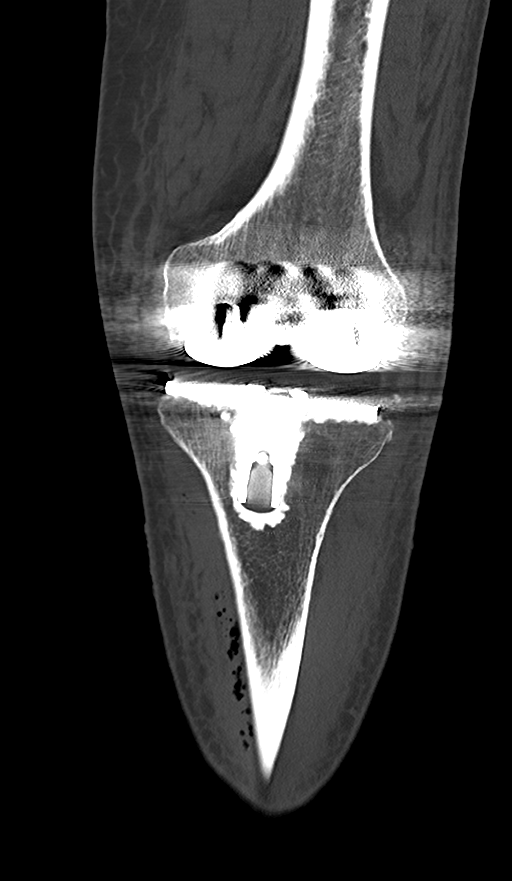
[im 58/97  bone]
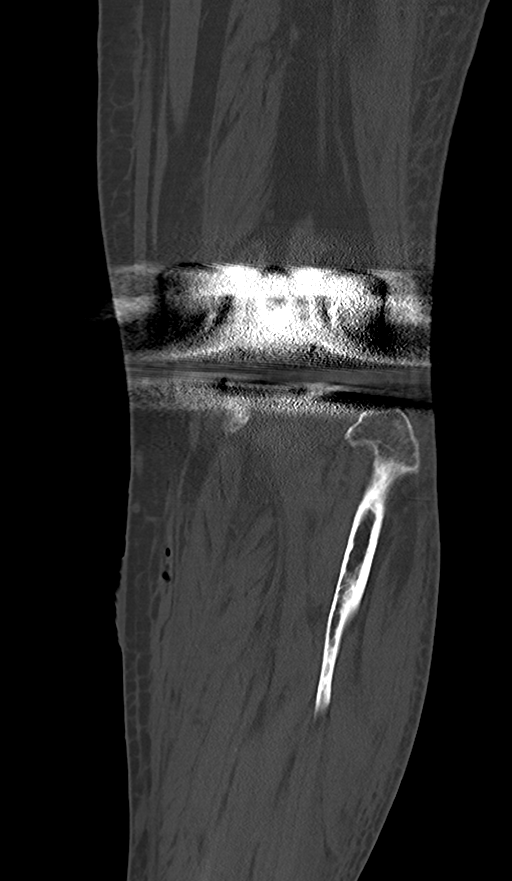

[Series 16: knee sag 2.00 br60 s3 · sagittal · 0.38mm/px · 5 of 93 slices shown, 6 images]
[im 31/93  bone]
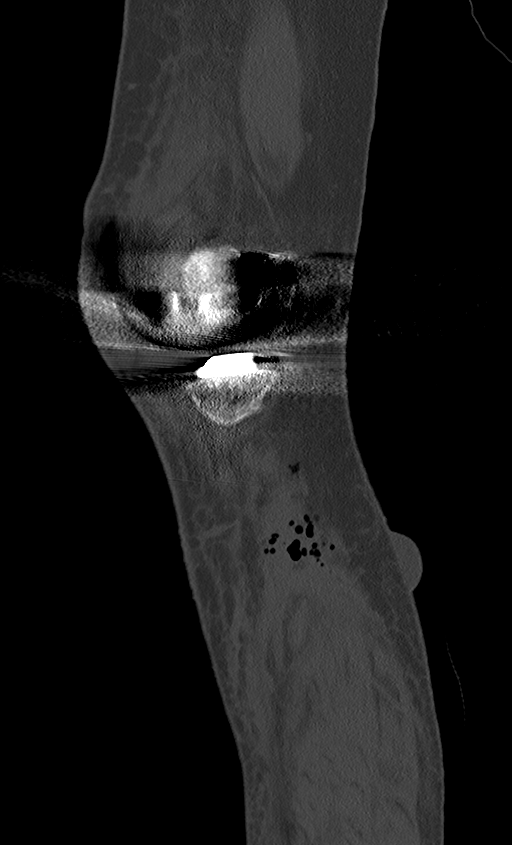
[im 39/93  bone]
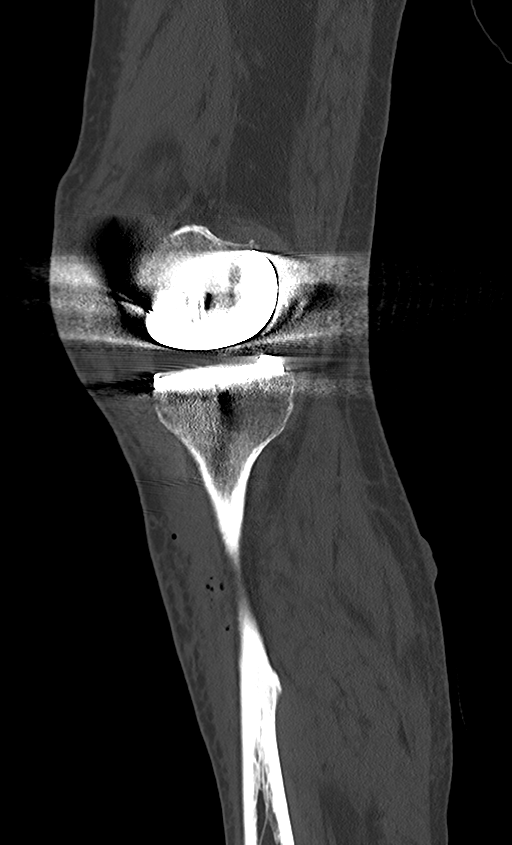
[im 47/93  soft-tissue]
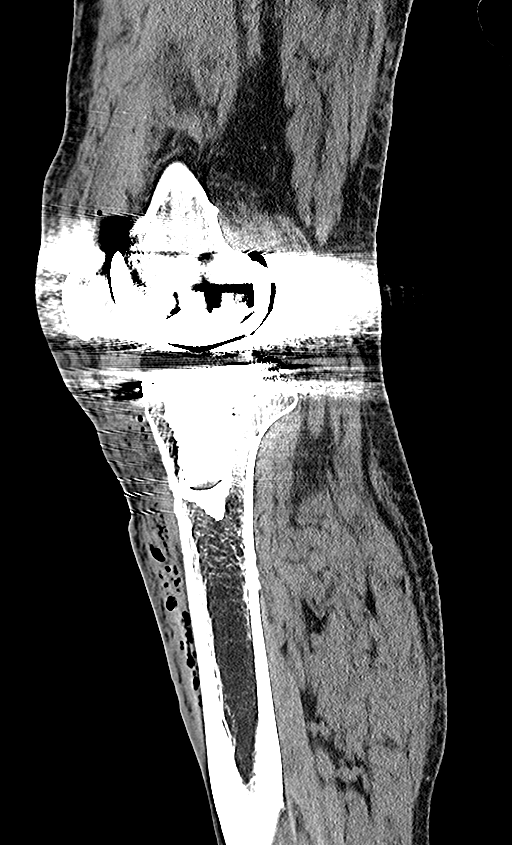
[im 47/93  bone]
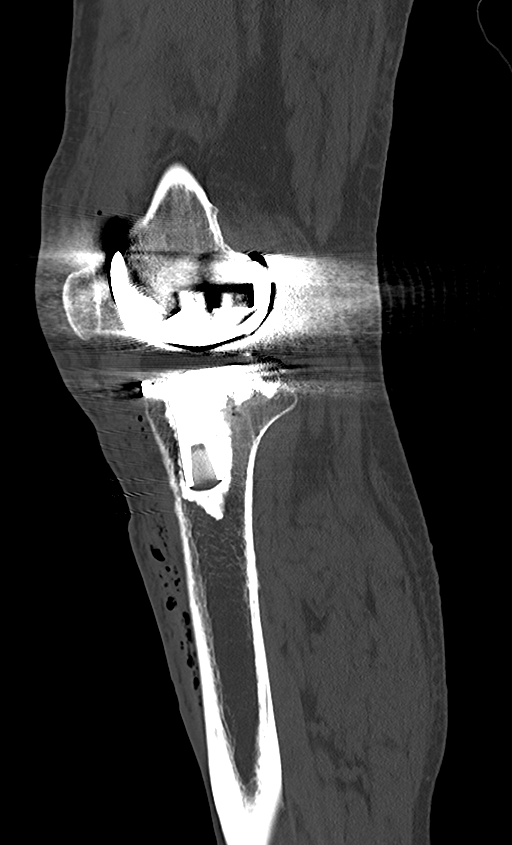
[im 54/93  bone]
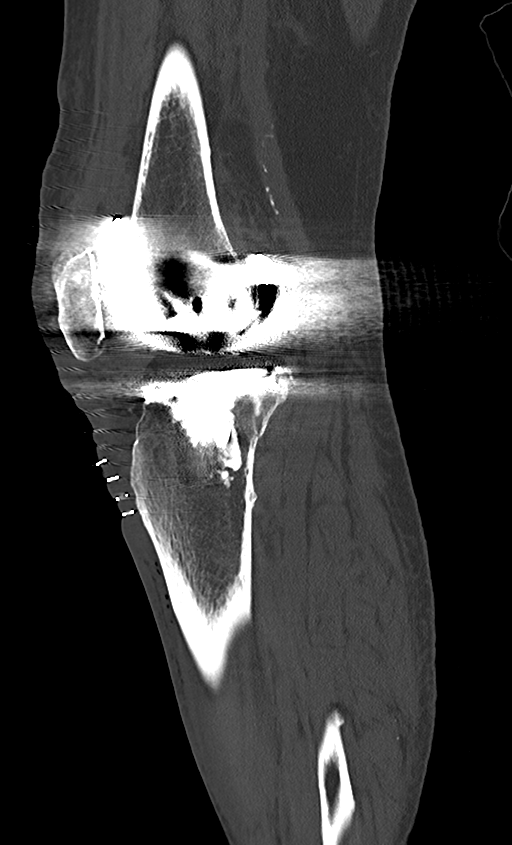
[im 62/93  bone]
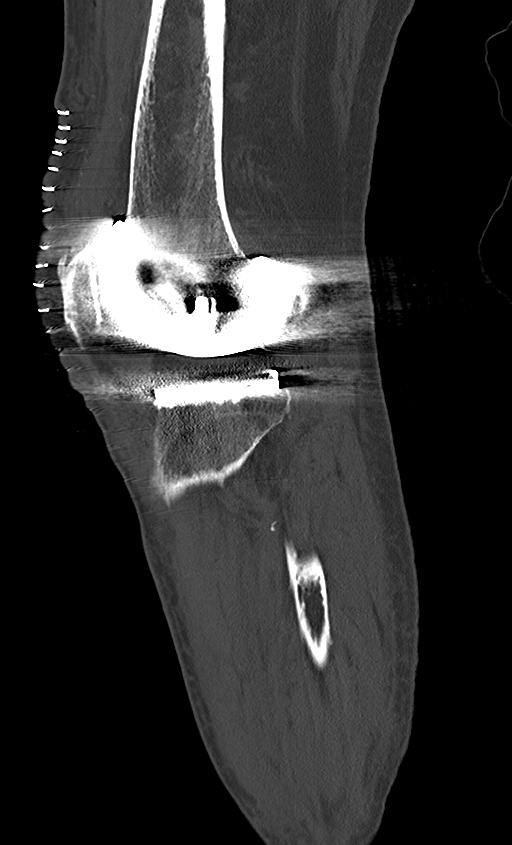

[11 of 33 positions shown; findings below may reference images not displayed]

DIAGNOSTIC STUDIES

EXAM

CT knee RT wo con

INDICATION

swelling
RT KNEE PAIN W/BLISTERS- 3 DAYS POST OP TOTAL KNEE.   HB

TECHNIQUE

All CT scans at this facility use dose modulation, iterative reconstruction, and/or weight based
dosing when appropriate to reduce radiation dose to as low as reasonably achievable.

Number of previous computed tomography exams in the last 12 months is 0  .

Number of previous nuclear medicine myocardial perfusion studies in the last 12 months is 0  .

COMPARISONS

None available

FINDINGS

Right knee prosthesis is in place demonstrating anatomic alignment. No fractures are seen.
Overlying skin staples are noted. There is knee effusion which contains a few small foci of air.
Small foci of gas can be seen with the in the subcutaneous fat and along the medial upper leg. This
presumably is postoperative however gas-forming infection could appear similar.

There is calcified plaque within the popliteal artery.

There are fluid-filled structures long skin surface of the posterior medial knee for instance image
108 series 2 where it measures 1 7 millimeters in thickness at and 3.1 cm in length. These likely
correspond of patient's reported blisters. Hives could appear similar.

IMPRESSION

Right knee prosthesis place demonstrating anatomic alignment. No fractures are seen.

Knee effusion is seen containing air. There is also small foci of gas throughout the soft tissues
all which likely is postoperative however underlying infection could appear similar. Correlation
with physical exam is recommended.

Small fluid filled structures along the posterior medial upper leg consistent with small blisters.

Report called to Dr. Lolland at [DATE] p.m..

Tech Notes:

RT KNEE PAIN W/BLISTERS- 3 DAYS POST OP TOTAL KNEE.
HB

## 2023-02-04 ENCOUNTER — Ambulatory Visit: Admit: 2023-02-04 | Discharge: 2023-02-04 | Payer: MEDICARE

## 2023-02-04 ENCOUNTER — Encounter: Admit: 2023-02-04 | Discharge: 2023-02-04 | Payer: MEDICARE

## 2023-02-04 DIAGNOSIS — M2550 Pain in unspecified joint: Secondary | ICD-10-CM

## 2023-02-04 DIAGNOSIS — I7143 Infrarenal abdominal aortic aneurysm (AAA) without rupture (HCC): Secondary | ICD-10-CM

## 2023-02-04 DIAGNOSIS — S060XAA Concussion: Secondary | ICD-10-CM

## 2023-02-04 DIAGNOSIS — I2699 Other pulmonary embolism without acute cor pulmonale: Secondary | ICD-10-CM

## 2023-02-04 DIAGNOSIS — E785 Hyperlipidemia, unspecified: Secondary | ICD-10-CM

## 2023-02-04 DIAGNOSIS — M5030 Other cervical disc degeneration, unspecified cervical region: Secondary | ICD-10-CM

## 2023-02-04 DIAGNOSIS — M1990 Unspecified osteoarthritis, unspecified site: Secondary | ICD-10-CM

## 2023-02-04 DIAGNOSIS — N200 Calculus of kidney: Secondary | ICD-10-CM

## 2023-02-04 DIAGNOSIS — M353 Polymyalgia rheumatica: Secondary | ICD-10-CM

## 2023-02-04 DIAGNOSIS — L120 Bullous pemphigoid: Secondary | ICD-10-CM

## 2023-02-04 DIAGNOSIS — I714 AAA (abdominal aortic aneurysm) (HCC): Secondary | ICD-10-CM

## 2023-02-04 DIAGNOSIS — D539 Nutritional anemia, unspecified: Secondary | ICD-10-CM

## 2023-02-04 DIAGNOSIS — I82409 Acute embolism and thrombosis of unspecified deep veins of unspecified lower extremity: Secondary | ICD-10-CM

## 2023-02-04 DIAGNOSIS — H269 Unspecified cataract: Secondary | ICD-10-CM

## 2023-02-04 DIAGNOSIS — I8393 Asymptomatic varicose veins of bilateral lower extremities: Secondary | ICD-10-CM

## 2023-02-04 DIAGNOSIS — N400 Enlarged prostate without lower urinary tract symptoms: Secondary | ICD-10-CM

## 2023-02-04 DIAGNOSIS — I6523 Occlusion and stenosis of bilateral carotid arteries: Secondary | ICD-10-CM

## 2023-02-04 DIAGNOSIS — I739 Peripheral vascular disease, unspecified: Secondary | ICD-10-CM

## 2023-02-04 DIAGNOSIS — Z95828 Presence of other vascular implants and grafts: Secondary | ICD-10-CM
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
## 2023-06-05 ENCOUNTER — Encounter: Admit: 2023-06-05 | Discharge: 2023-06-05 | Payer: MEDICARE

## 2023-06-10 ENCOUNTER — Encounter: Admit: 2023-06-10 | Discharge: 2023-06-10 | Payer: MEDICARE

## 2023-06-12 ENCOUNTER — Encounter: Admit: 2023-06-12 | Discharge: 2023-06-12 | Payer: MEDICARE

## 2023-06-12 DIAGNOSIS — N201 Calculus of ureter: Secondary | ICD-10-CM

## 2023-06-12 DIAGNOSIS — N401 Enlarged prostate with lower urinary tract symptoms: Secondary | ICD-10-CM

## 2023-06-12 DIAGNOSIS — N210 Calculus in bladder: Secondary | ICD-10-CM

## 2023-06-12 NOTE — Progress Notes
Reviewed Dr. Alva Garnet prior notes and last CT scan.    (+)(L) distal ureteral stone.  (+)bladder stone.  (+)h/o urinary retention.    Our office will contact pt to recommend r/s Urology f/u appt ~ April 2025, w/ repeat CT & BMP ~ 1 wk prior.      Orders Placed This Encounter    CT ABD/PELV WO CONTRAST [facility] Bras Protocol)    BASIC METABOLIC PANEL         Marin Roberts, PA-C  Urology

## 2023-07-10 ENCOUNTER — Encounter: Admit: 2023-07-10 | Discharge: 2023-07-10 | Payer: MEDICARE

## 2023-09-05 ENCOUNTER — Ambulatory Visit: Admit: 2023-09-05 | Discharge: 2023-09-05

## 2023-09-05 ENCOUNTER — Encounter: Admit: 2023-09-05 | Discharge: 2023-09-05

## 2023-09-07 ENCOUNTER — Encounter: Admit: 2023-09-07 | Discharge: 2023-09-07

## 2023-09-15 ENCOUNTER — Encounter: Admit: 2023-09-15 | Discharge: 2023-09-15 | Payer: MEDICARE

## 2023-09-15 ENCOUNTER — Ambulatory Visit: Admit: 2023-09-15 | Discharge: 2023-09-16 | Payer: MEDICARE

## 2023-09-18 ENCOUNTER — Encounter: Admit: 2023-09-18 | Discharge: 2023-09-18 | Payer: MEDICARE

## 2023-10-29 ENCOUNTER — Encounter: Admit: 2023-10-29 | Discharge: 2023-10-29 | Payer: MEDICARE

## 2024-04-05 ENCOUNTER — Encounter: Admit: 2024-04-05 | Discharge: 2024-04-05 | Payer: MEDICARE

## 2024-04-05 DIAGNOSIS — Z95828 Presence of other vascular implants and grafts: Secondary | ICD-10-CM

## 2024-04-05 DIAGNOSIS — I7143 Infrarenal abdominal aortic aneurysm (AAA) without rupture: Principal | ICD-10-CM

## 2024-04-06 ENCOUNTER — Encounter: Admit: 2024-04-06 | Discharge: 2024-04-06 | Payer: MEDICARE

## 2024-04-06 ENCOUNTER — Ambulatory Visit: Admit: 2024-04-06 | Discharge: 2024-04-06 | Payer: MEDICARE

## 2024-04-06 VITALS — BP 131/65 | HR 64 | Temp 97.20000°F | Ht 67.0 in | Wt 160.0 lb

## 2024-04-06 DIAGNOSIS — I7143 Infrarenal abdominal aortic aneurysm (AAA) without rupture: Principal | ICD-10-CM

## 2024-04-06 DIAGNOSIS — Z95828 Presence of other vascular implants and grafts: Secondary | ICD-10-CM

## 2024-04-06 NOTE — Progress Notes [1]
 Date of Service: 04/06/2024              Chief Complaint   Patient presents with    Follow Up     1 yr AAA       History of Present Illness  This a very pleasant 89 year old male who presents in follow-up for his abdominal aortic aneurysm.  We performed endovascular abdominal aortic aneurysm repair in December 2019.  He is continue to do well since that time.  He is accompanied by his daughter at the bedside today.  He remains very active despite his age.      Past Medical History:    AAA (abdominal aortic aneurysm)    Allergy    Arthritis    Bladder stone    BPH (benign prostatic hyperplasia)    Carotid artery plaque, bilateral    Cataract    Concussion    DDD (degenerative disc disease), cervical    Deep vein thrombosis (DVT) (CMS-HCC)    Hyperlipidemia    Joint pain    Nephrolithiasis    Peripheral artery disease    Polymyalgia rheumatica    Pulmonary embolism (CMS-HCC)    Unspecified deficiency anemia    Varicose veins of legs       Surgical History:   Procedure Laterality Date    APPENDECTOMY  1950    ABDOMEN SURGERY  2016    2/2 strangulated hernia     KNEE ARTHROSCOPY Left 2017    ENDOVASCULAR ANEURYSM REPAIR Bilateral 05/11/2018    Performed by Betsy Dibble, DO at [facility]    LASER DISCISSION SECONDARY MEMBRANOUS CATARACT Left 07/28/2018    Performed by Donnalee Arlean NOVAK, MD at [facility] OR    LASER DISCISSION SECONDARY MEMBRANOUS CATARACT Right 08/11/2018    Performed by Donnalee Arlean NOVAK, MD at [facility] OR    ARTERIAL ANEURYSM REPAIR      Stent    ARTHROPLASTY      CATARACT REMOVAL WITH IMPLANT Bilateral ~2017    HX CATARACT REMOVAL      HX JOINT REPLACEMENT  [id number]    INGUINAL HERNIA REPAIR Bilateral 2002, 2016    KNEE SURGERY  [id number]    PR LAPAROSCOPY SURG RPR INITIAL INGUINAL HERNIA      TONSILLECTOMY  1950s    VEIN STRIPPING  1990s       Allergies:  Allergies[1]    Medication List:   aspirin  81 mg cap Take 81 mg by mouth daily. Takes occassionally (Patient not taking: Reported on 04/06/2024)       Social History: reports that he quit smoking about 5 years ago. His smoking use included cigarettes. He started smoking about 77 years ago. He has a 71 pack-year smoking history. He has never used smokeless tobacco. He reports that he does not currently use alcohol. He reports that he does not use drugs.    Family History   Problem Relation Name Age of Onset    Hypertension Mother [facility]     Arthritis Mother Lillie     Cancer Father Dejaun Vidrio     Cancer Sister Ronal Sprung     Cancer Brother Lleyton Byers     Cancer Sister Dedra     Cataract Neg Hx      Glaucoma Neg Hx      Macular Degen Neg Hx      Alcohol liver disease Brother Fairy        Review of Systems  Vitals:    04/06/24 1021   BP: 131/65  Comment: Segmental BP (R) 137 (L) 124   BP Source: Arm, Right Upper   Pulse: 64   Temp: 36.2 ?C (97.2 ?F)   TempSrc: Temporal   PainSc: Zero   Weight: 72.6 kg (160 lb)   Height: 170.2 cm (5' 7)     Body mass index is 25.06 kg/m?SABRA   Right Brachial Systolic: 137   Left Brachial Systolic: 124   Right ABI: 1.1  Left ABI: 1.12    Physical Exam  Constitutional:       Appearance: He is well-developed. He is not diaphoretic.   HENT:      Head: Normocephalic and atraumatic.   Eyes:      General:         Left eye: No discharge.      Conjunctiva/sclera: Conjunctivae normal.      Pupils: Pupils are equal, round, and reactive to light.   Neck:      Thyroid: No thyromegaly.      Vascular: No JVD.      Trachea: No tracheal deviation.   Cardiovascular:      Rate and Rhythm: Normal rate and regular rhythm.      Pulses:           Carotid pulses are 2+ on the right side and 2+ on the left side.       Radial pulses are 2+ on the right side and 2+ on the left side.        Femoral pulses are 2+ on the right side and 2+ on the left side.       Popliteal pulses are 2+ on the right side and 2+ on the left side.        Dorsalis pedis pulses are 2+ on the right side and 2+ on the left side.        Posterior tibial pulses are 2+ on the right side and 2+ on the left side.      Heart sounds: Normal heart sounds. No murmur heard.     No friction rub. No gallop.   Pulmonary:      Effort: Pulmonary effort is normal. No respiratory distress.      Breath sounds: Normal breath sounds. No wheezing or rales.   Abdominal:      General: Bowel sounds are normal. There is no distension.      Palpations: Abdomen is soft. There is no mass.      Tenderness: There is no abdominal tenderness.   Musculoskeletal:         General: No tenderness or deformity. Normal range of motion.      Cervical back: Normal range of motion and neck supple.   Skin:     General: Skin is warm and dry.      Findings: No erythema.   Neurological:      Mental Status: He is alert and oriented to person, place, and time.      Cranial Nerves: No cranial nerve deficit.   Psychiatric:         Behavior: Behavior normal.             Assessment and Plan:    Asymptomatic infrarenal abdominal aortic aneurysm--I reviewed the patient's aortic duplex today which shows a residual aneurysm sac of 3.8 cm.  This is unchanged from previous measurements.  He has no evidence of endoleak.  He has no evidence of iliac degeneration.  His ankle-brachial  index testing is normal bilaterally.  I will see him back in 1 year for the same testing.  All questions were answered to his and his daughter satisfaction today.      Jorie Brisk, DO, FACS, RPVI  Assistant Professor of Vascular Surgery  [facility]              [1]   Allergies  Allergen Reactions    Bactrim [Sulfamethoxazole-Trimethoprim] RASH    Tramadol  VOMITING

## 2024-04-06 NOTE — Patient Instructions [37]
 Dr. Betsy has examined you, reviewed your testing and advises that it is stable.  He does not recommend any surgery and/or procedures at this time.  He does recommend a follow-up in approximately 1 year with the following testing:  ABIs and Ultrasound - Our office will contact you closer to that time (usually 2-3 months in advance) to assist with scheduling if you did not already schedule today.    Things you can do to help reduce the risk of disease progression:  - Remain as active as possible  - Continue medications as prescribed  - Monitor and keep good control of blood pressures, cholesterol levels and if diabetic, blood sugars.  - If you smoke, continue efforts to stop smoking - smoking directly affects your blood vessels negatively     If symptoms worsen, please call our office for re-evaluation at [phone]

## 2024-06-16 ENCOUNTER — Encounter: Admit: 2024-06-16 | Discharge: 2024-06-16 | Payer: MEDICARE

## 2024-06-17 ENCOUNTER — Encounter: Admit: 2024-06-17 | Discharge: 2024-06-17 | Payer: MEDICARE
